# Patient Record
Sex: Female | Born: 1950 | Race: Black or African American | Hispanic: No | Smoking: Former smoker
Health system: Southern US, Community
[De-identification: ages and names within clinical notes are randomized; demographics above are authoritative.]

## PROBLEM LIST (undated history)

## (undated) DIAGNOSIS — E785 Hyperlipidemia, unspecified: Secondary | ICD-10-CM

## (undated) DIAGNOSIS — I10 Essential (primary) hypertension: Secondary | ICD-10-CM

## (undated) DIAGNOSIS — H409 Unspecified glaucoma: Secondary | ICD-10-CM

## (undated) DIAGNOSIS — H269 Unspecified cataract: Secondary | ICD-10-CM

## (undated) DIAGNOSIS — T7840XA Allergy, unspecified, initial encounter: Secondary | ICD-10-CM

## (undated) DIAGNOSIS — M109 Gout, unspecified: Secondary | ICD-10-CM

## (undated) DIAGNOSIS — K219 Gastro-esophageal reflux disease without esophagitis: Secondary | ICD-10-CM

## (undated) DIAGNOSIS — I7300 Raynaud's syndrome without gangrene: Secondary | ICD-10-CM

## (undated) HISTORY — DX: Hyperlipidemia, unspecified: E78.5

## (undated) HISTORY — DX: Unspecified glaucoma: H40.9

## (undated) HISTORY — DX: Gastro-esophageal reflux disease without esophagitis: K21.9

## (undated) HISTORY — PX: CATARACT EXTRACTION: SUR2

## (undated) HISTORY — PX: ESOPHAGOGASTRODUODENOSCOPY: SHX1529

## (undated) HISTORY — DX: Allergy, unspecified, initial encounter: T78.40XA

## (undated) HISTORY — PX: EYE SURGERY: SHX253

## (undated) HISTORY — DX: Unspecified cataract: H26.9

## (undated) HISTORY — DX: Essential (primary) hypertension: I10

---
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
## 1978-03-20 HISTORY — PX: APPENDECTOMY: SHX54

## 2004-06-21 ENCOUNTER — Ambulatory Visit: Payer: Self-pay

## 2004-10-11 ENCOUNTER — Ambulatory Visit: Payer: Self-pay | Admitting: Family Medicine

## 2005-06-28 ENCOUNTER — Ambulatory Visit: Payer: Self-pay

## 2006-06-12 ENCOUNTER — Ambulatory Visit: Payer: Self-pay

## 2007-07-02 ENCOUNTER — Ambulatory Visit: Payer: Self-pay

## 2008-05-18 ENCOUNTER — Ambulatory Visit: Payer: Self-pay | Admitting: Gastroenterology

## 2008-07-02 ENCOUNTER — Ambulatory Visit: Payer: Self-pay

## 2008-07-08 ENCOUNTER — Ambulatory Visit: Payer: Self-pay

## 2009-05-12 DIAGNOSIS — M899 Disorder of bone, unspecified: Secondary | ICD-10-CM | POA: Insufficient documentation

## 2009-11-11 ENCOUNTER — Ambulatory Visit: Payer: Self-pay | Admitting: Family Medicine

## 2011-01-11 ENCOUNTER — Ambulatory Visit: Payer: Self-pay | Admitting: Family Medicine

## 2011-03-02 ENCOUNTER — Ambulatory Visit: Payer: Self-pay | Admitting: Family Medicine

## 2011-09-11 ENCOUNTER — Ambulatory Visit: Payer: Self-pay | Admitting: Family Medicine

## 2011-09-15 ENCOUNTER — Ambulatory Visit: Payer: Self-pay | Admitting: Gastroenterology

## 2011-09-18 ENCOUNTER — Ambulatory Visit: Payer: Self-pay | Admitting: Gastroenterology

## 2012-05-30 ENCOUNTER — Ambulatory Visit: Payer: Self-pay | Admitting: Family Medicine

## 2013-08-22 ENCOUNTER — Ambulatory Visit: Payer: Self-pay | Admitting: Family Medicine

## 2013-12-25 DIAGNOSIS — K5790 Diverticulosis of intestine, part unspecified, without perforation or abscess without bleeding: Secondary | ICD-10-CM

## 2013-12-25 HISTORY — DX: Diverticulosis of intestine, part unspecified, without perforation or abscess without bleeding: K57.90

## 2013-12-25 HISTORY — PX: COLONOSCOPY: SHX174

## 2014-01-28 DIAGNOSIS — M069 Rheumatoid arthritis, unspecified: Secondary | ICD-10-CM | POA: Insufficient documentation

## 2014-01-28 HISTORY — DX: Rheumatoid arthritis, unspecified: M06.9

## 2014-09-15 ENCOUNTER — Ambulatory Visit (INDEPENDENT_AMBULATORY_CARE_PROVIDER_SITE_OTHER): Payer: BC Managed Care – PPO | Admitting: Family Medicine

## 2014-09-15 ENCOUNTER — Encounter: Payer: Self-pay | Admitting: Family Medicine

## 2014-09-15 VITALS — BP 132/82 | HR 70 | Temp 98.1°F | Resp 16 | Wt 152.4 lb

## 2014-09-15 DIAGNOSIS — I7300 Raynaud's syndrome without gangrene: Secondary | ICD-10-CM | POA: Insufficient documentation

## 2014-09-15 DIAGNOSIS — M545 Low back pain, unspecified: Secondary | ICD-10-CM | POA: Insufficient documentation

## 2014-09-15 DIAGNOSIS — M810 Age-related osteoporosis without current pathological fracture: Secondary | ICD-10-CM | POA: Insufficient documentation

## 2014-09-15 DIAGNOSIS — J069 Acute upper respiratory infection, unspecified: Secondary | ICD-10-CM

## 2014-09-15 DIAGNOSIS — G5600 Carpal tunnel syndrome, unspecified upper limb: Secondary | ICD-10-CM | POA: Insufficient documentation

## 2014-09-15 DIAGNOSIS — IMO0002 Reserved for concepts with insufficient information to code with codable children: Secondary | ICD-10-CM | POA: Insufficient documentation

## 2014-09-15 DIAGNOSIS — N949 Unspecified condition associated with female genital organs and menstrual cycle: Secondary | ICD-10-CM | POA: Insufficient documentation

## 2014-09-15 DIAGNOSIS — E7800 Pure hypercholesterolemia, unspecified: Secondary | ICD-10-CM | POA: Insufficient documentation

## 2014-09-15 DIAGNOSIS — I4892 Unspecified atrial flutter: Secondary | ICD-10-CM

## 2014-09-15 DIAGNOSIS — I10 Essential (primary) hypertension: Secondary | ICD-10-CM | POA: Insufficient documentation

## 2014-09-15 DIAGNOSIS — R040 Epistaxis: Secondary | ICD-10-CM | POA: Insufficient documentation

## 2014-09-15 DIAGNOSIS — M8580 Other specified disorders of bone density and structure, unspecified site: Secondary | ICD-10-CM | POA: Insufficient documentation

## 2014-09-15 DIAGNOSIS — I4891 Unspecified atrial fibrillation: Secondary | ICD-10-CM | POA: Insufficient documentation

## 2014-09-15 DIAGNOSIS — M461 Sacroiliitis, not elsewhere classified: Secondary | ICD-10-CM | POA: Insufficient documentation

## 2014-09-15 DIAGNOSIS — R195 Other fecal abnormalities: Secondary | ICD-10-CM | POA: Insufficient documentation

## 2014-09-15 DIAGNOSIS — K635 Polyp of colon: Secondary | ICD-10-CM | POA: Insufficient documentation

## 2014-09-15 NOTE — Patient Instructions (Signed)
Discussed use of Mucinex D for congestion, Delsym for cough, and Benadryl for postnasal drainage 

## 2014-09-15 NOTE — Progress Notes (Signed)
Subjective:     Patient ID: Courtney Rodgers, female   DOB: 04/05/1950, 64 y.o.   MRN: [id number]  HPI  Chief Complaint  Patient presents with  . URI    patient is present in office today with concerns of congestion and sore throat for 3 days and cough for  5 days. Patient reports that she has had episodes of light nose bleed from the left nostril  States she just started to cough today, sinus drainage is thick and clear. Preceding this she has had intermittent sore throat for several weeks. Denies heartburn but has hx of allergies.   Review of Systems  Constitutional: Negative for fever and chills.       Objective:   Physical Exam  Constitutional: She appears well-developed and well-nourished. No distress.  Ears: T.M's intact without inflammation Throat: no tonsillar enlargement or exudate Neck: no cervical adenopathy Lungs: clear     Assessment:    1. Upper respiratory infection    Plan:    Discussed use of Mucinex D for congestion, Delsym for cough, and Benadryl for postnasal drainage Wll f/u in 2-3 weeks.

## 2014-10-05 ENCOUNTER — Encounter: Payer: Self-pay | Admitting: Family Medicine

## 2014-10-05 ENCOUNTER — Ambulatory Visit (INDEPENDENT_AMBULATORY_CARE_PROVIDER_SITE_OTHER): Payer: BC Managed Care – PPO | Admitting: Family Medicine

## 2014-10-05 VITALS — BP 130/78 | HR 83 | Temp 98.5°F | Resp 16

## 2014-10-05 DIAGNOSIS — J309 Allergic rhinitis, unspecified: Secondary | ICD-10-CM | POA: Diagnosis not present

## 2014-10-05 DIAGNOSIS — J029 Acute pharyngitis, unspecified: Secondary | ICD-10-CM

## 2014-10-05 MED ORDER — AMOXICILLIN 875 MG PO TABS: 875.0000 mg | ORAL_TABLET | Freq: Two times a day (BID) | ORAL | Status: DC

## 2014-10-05 MED ORDER — FLUTICASONE PROPIONATE 50 MCG/ACT NA SUSP: 2.0000 | Freq: Every day | NASAL | Status: DC

## 2014-10-05 NOTE — Progress Notes (Signed)
Subjective:     Patient ID: Courtney Rodgers, female   DOB: 11/17/1950, 64 y.o.   MRN: [id number]  HPI  Chief Complaint  Patient presents with  . URI    patient is here for follow up visit from 09/15/14. It was discussed patient try Mucinex D, Delysm and Benadryl. Today patient states that symptoms did not improve but are remaining the same  States she feels cold-like sx have improved but is left with throat discomfort, clear PND, and accompanying cough. Reports she have been cleaning out an Aunt's house prior to the onset of her sx which seem to worsen at night. States she has no pets but her aunt has a dog.   Review of Systems  Constitutional: Negative for fever and chills.       Objective:   Physical Exam  Constitutional: She appears well-developed and well-nourished.  Ears: T.M's intact without inflammation Throat: no tonsillar enlargement or exudate Neck: left anterior cervical node. Lungs: clear     Assessment:    1. Pharyngitis - amoxicillin (AMOXIL) 875 MG tablet; Take 1 tablet (875 mg total) by mouth 2 (two) times daily.  Dispense: 20 tablet; Refill: 0  2. Allergic rhinitis, unspecified allergic rhinitis type - fluticasone (FLONASE) 50 MCG/ACT nasal spray; Place 2 sprays into both nostrils daily.  Dispense: 16 g; Refill: 6    Plan:    Start amoxicillin if allergy medication not helping over the next few days.

## 2014-10-05 NOTE — Patient Instructions (Signed)
Try steroid nasal spray and antihistamine first for a few days. If sore throat persists start antibiotic.

## 2014-10-15 ENCOUNTER — Other Ambulatory Visit: Payer: Self-pay | Admitting: Family Medicine

## 2014-10-15 NOTE — Telephone Encounter (Signed)
Next office visit was on 12/21/2014. Last Office visit was on 10/05/2014.

## 2014-12-21 ENCOUNTER — Ambulatory Visit (INDEPENDENT_AMBULATORY_CARE_PROVIDER_SITE_OTHER): Payer: BC Managed Care – PPO | Admitting: Family Medicine

## 2014-12-21 ENCOUNTER — Encounter: Payer: Self-pay | Admitting: Family Medicine

## 2014-12-21 VITALS — BP 130/74 | HR 82 | Temp 97.5°F | Resp 16 | Ht 63.0 in | Wt 154.0 lb

## 2014-12-21 DIAGNOSIS — R21 Rash and other nonspecific skin eruption: Secondary | ICD-10-CM

## 2014-12-21 DIAGNOSIS — Z1239 Encounter for other screening for malignant neoplasm of breast: Secondary | ICD-10-CM

## 2014-12-21 DIAGNOSIS — Z0000 Encounter for general adult medical examination without abnormal findings: Secondary | ICD-10-CM | POA: Diagnosis not present

## 2014-12-21 DIAGNOSIS — Z124 Encounter for screening for malignant neoplasm of cervix: Secondary | ICD-10-CM

## 2014-12-21 DIAGNOSIS — I10 Essential (primary) hypertension: Secondary | ICD-10-CM | POA: Diagnosis not present

## 2014-12-21 DIAGNOSIS — Z23 Encounter for immunization: Secondary | ICD-10-CM

## 2014-12-21 DIAGNOSIS — Z114 Encounter for screening for human immunodeficiency virus [HIV]: Secondary | ICD-10-CM

## 2014-12-21 DIAGNOSIS — Z1159 Encounter for screening for other viral diseases: Secondary | ICD-10-CM

## 2014-12-21 DIAGNOSIS — E7800 Pure hypercholesterolemia, unspecified: Secondary | ICD-10-CM

## 2014-12-21 LAB — POCT URINALYSIS DIPSTICK
Bilirubin, UA: NEGATIVE
Glucose, UA: NEGATIVE
KETONES UA: NEGATIVE
Leukocytes, UA: NEGATIVE
Nitrite, UA: NEGATIVE
Protein, UA: NEGATIVE
RBC UA: NEGATIVE
Spec Grav, UA: 1.01
UROBILINOGEN UA: 0.2
pH, UA: 8

## 2014-12-21 NOTE — Progress Notes (Signed)
Patient ID: Jesilyn Easom, female   DOB: 02/14/1951, 64 y.o.   MRN: [id number]        Patient: Nikcole Eischeid, Female    DOB: 1950-08-21, 64 y.o.   MRN: [id number] Visit Date: 12/21/2014  Today's Provider: Lorie Phenix, MD   Chief Complaint  Patient presents with  . Annual Exam  . Rash   Subjective:    Annual physical exam Yong Wahlquist is a 64 y.o. female who presents today for health maintenance and complete physical. She feels well. She reports exercising 5-6 days a week. She reports she is sleeping fairly well.  08/18/13 CPE 02/22/11 Pap-neg; HPV-neg 12/25/13 Colonoscopy-WNL 07/08/08 BMD 02/22/11 EKG  Rash: Patient complains of rash involving the buttocks. Rash started 1 year ago (on and off). Appearance of rash at onset: Texture of lesion(s): raised and blistering. Discomfort associated with rash: causes no discomfort.  Associated symptoms: none. Denies: fever. Patient has not had previous evaluation of rash. Patient reports using OTC treatment A & D ointment. Patient has had contacts with similar rash. Patient has not identified precipitant. Patient has not had new exposures (soaps, lotions, laundry detergents, foods, medications, plants, insects or animals.)   -----------------------------------------------------------------   Review of Systems  Constitutional: Positive for unexpected weight change.  HENT: Positive for congestion.   Eyes: Negative.   Respiratory: Negative.   Cardiovascular: Negative.   Gastrointestinal: Negative.   Endocrine: Negative.   Genitourinary: Negative.   Musculoskeletal: Negative.   Skin: Positive for wound.  Allergic/Immunologic: Negative.   Neurological: Negative.   Hematological: Negative.   Psychiatric/Behavioral: Negative.     Social History She  reports that she has quit smoking. Her smoking use included Cigarettes. She has a 20 pack-year smoking history. She has never used smokeless tobacco. She reports that she drinks  about 4.2 oz of alcohol per week. She reports that she does not use illicit drugs. Social History   Social History  . Marital Status: Divorced    Spouse Name: N/A  . Number of Children: N/A  . Years of Education: N/A   Social History Main Topics  . Smoking status: Former Smoker -- 1.00 packs/day for 20 years    Types: Cigarettes  . Smokeless tobacco: Never Used     Comment: QUIT IN 2010  . Alcohol Use: 4.2 oz/week    0 Standard drinks or equivalent, 7 Glasses of wine per week  . Drug Use: No  . Sexual Activity: Not Asked   Other Topics Concern  . None   Social History Narrative    Patient Active Problem List   Diagnosis Date Noted  . Essential (primary) hypertension 09/15/2014  . Hypercholesteremia 09/15/2014  . Hyperplastic polyp of intestine 09/15/2014  . LBP (low back pain) 09/15/2014  . Osteopenia 09/15/2014  . Symptom associated with female genital organs 09/15/2014  . Flutter-fibrillation 09/15/2014  . Epistaxis 09/15/2014  . 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 Nonspecific abnormal finding in stool contents 09/15/2014  . 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 BP (high blood pressure) 09/15/2014  . Carpal tunnel syndrome 09/15/2014  . Rheumatoid arthritis (HCC) 01/28/2014  . Arthritis or polyarthritis, rheumatoid (HCC) 05/12/2009  . Bone/cartilage disorder 05/12/2009    Past Surgical History  Procedure Laterality Date  . Appendectomy  1980  . Tonsillectomy  1960    Family History  Family Status  Relation Status Death Age  . Mother Alive   . Father Deceased   . Daughter Alive   . Sister Alive   .  Brother Alive   . Brother Alive   . Brother Alive   . Brother Alive   . Sister Alive   . Sister Alive    Her family history includes Alcohol abuse in her father; CVA in her father; Congestive Heart Failure in her mother; Dementia in her mother; Diabetes in her mother; Drug abuse in her father; Emphysema in her father; Hypertension in  her brother, brother, father, mother, sister, and sister; Seizures in her brother; Transient ischemic attack in her mother.    No Known Allergies  Previous Medications   ASPIRIN 81 MG EC TABLET    1 tablet daily.   BIOTIN 5000 MCG CAPS    1 capsule daily.   CALCIUM CARBONATE-VIT D-MIN (CALCIUM 1200 PO)    1 tablet daily.   CARVEDILOL (COREG) 12.5 MG TABLET    Take 1 tablet by mouth 2 (two) times daily.   FOLIC ACID (FOLVITE) 800 MCG TABLET    1 tablet daily.   METHOTREXATE 2.5 MG TABLET    7 tablets once a week.    OMEGA-3 FATTY ACIDS (FISH OIL BURP-LESS) 1000 MG CAPS    Take 1 capsule by mouth daily.    TRIAMTERENE-HYDROCHLOROTHIAZIDE (MAXZIDE-25) 37.5-25 MG PER TABLET    TAKE 1 TABLET EVERY DAY    Patient Care Team: Lorie Phenix, MD as PCP - General (Family Medicine)     Objective:   Vitals: BP 130/74 mmHg  Pulse 82  Temp(Src) 97.5 F (36.4 C) (Oral)  Resp 16  Ht 5\' 3"  (1.6 m)  Wt 154 lb (69.854 kg)  BMI 27.29 kg/m2  SpO2 96%   Physical Exam  Constitutional: She is oriented to person, place, and time. She appears well-developed and well-nourished.  HENT:  Head: Normocephalic and atraumatic.  Right Ear: Tympanic membrane, external ear and ear canal normal.  Left Ear: Tympanic membrane, external ear and ear canal normal.  Nose: Nose normal.  Mouth/Throat: Uvula is midline, oropharynx is clear and moist and mucous membranes are normal.  Eyes: Conjunctivae, EOM and lids are normal. Pupils are equal, round, and reactive to light.  Neck: Trachea normal and normal range of motion. Neck supple. Carotid bruit is not present. No thyroid mass and no thyromegaly present.  Cardiovascular: Normal rate, regular rhythm and normal heart sounds.   Pulmonary/Chest: Effort normal and breath sounds normal.  Abdominal: Soft. Normal appearance and bowel sounds are normal. There is no hepatosplenomegaly. There is no tenderness.  Genitourinary: Vagina normal. Rectal exam shows external  hemorrhoid. No breast swelling, tenderness or discharge.  Musculoskeletal: Normal range of motion.  Lymphadenopathy:    She has no cervical adenopathy.    She has no axillary adenopathy.  Neurological: She is alert and oriented to person, place, and time. She has normal strength. No cranial nerve deficit.  Skin: Skin is warm, dry and intact. Lesion (blistered patches) and rash noted.  Psychiatric: She has a normal mood and affect. Her speech is normal and behavior is normal. Judgment and thought content normal. Cognition and memory are normal.     Depression Screen PHQ 2/9 Scores 12/21/2014  PHQ - 2 Score 0   History  Alcohol Use  . 4.2 oz/week  . 0 Standard drinks or equivalent, 7 Glasses of wine per week      Assessment & Plan:     Routine Health Maintenance and Physical Exam  Exercise Activities and Dietary recommendations Goals    . Exercise 150 minutes per week (moderate activity)  Immunization History  Administered Date(s) Administered  . Influenza,inj,Quad PF,36+ Mos 12/21/2014  . Tdap 10/18/2009    Health Maintenance  Topic Date Due  . Hepatitis C Screening  1950-10-01  . HIV Screening  06/03/1967  . PAP SMEAR  06/02/1973  . MAMMOGRAM  06/03/2002  . COLONOSCOPY  06/03/2002  . ZOSTAVAX  06/02/2012  . INFLUENZA VACCINE  10/19/2014  . TETANUS/TDAP  10/19/2019   1. Annual physical exam As above. - POCT urinalysis dipstick Results for orders placed or performed in visit on 12/21/14  POCT urinalysis dipstick  Result Value Ref Range   Color, UA yellow    Clarity, UA clear    Glucose, UA neg    Bilirubin, UA neg    Ketones, UA neg    Spec Grav, UA 1.010    Blood, UA neg    pH, UA 8.0    Protein, UA neg    Urobilinogen, UA 0.2    Nitrite, UA neg    Leukocytes, UA Negative Negative    2. Need for influenza vaccination - Flu Vaccine QUAD 36+ mos IM  3. Essential (primary) hypertension Condition is stable. Please continue current medication  and  plan of care as noted.   - Comprehensive metabolic panel - TSH  4. Hypercholesteremia Check labs.  - CBC with Differential/Platelet - Lipid Panel With LDL/HDL Ratio  5. Need for hepatitis C screening test Will check labs.  - Hepatitis C antibody  6. Screening for HIV (human immunodeficiency virus) Will check labs.  - HIV antibody (with reflex)  7. Rash and nonspecific skin eruption New problem.  Suspicious for herpes.  Will check culture. No known exposure.  No history and not sexually active in many years.  - Viral Culture, Rapid, CMV  8. Breast cancer screening - MM DIGITAL SCREENING BILATERAL; Future  9. Cervical cancer screening - Pap IG and HPV (high risk) DNA detection  Patient was seen and examined by Leo Grosser, MD, and note scribed by Rondel Baton, CMA.   I have reviewed the document for accuracy and completeness and I agree with above. Leo Grosser, MD   Lorie Phenix, MD    --------------------------------------------------------------------          Elbert Ewings

## 2014-12-21 NOTE — Patient Instructions (Signed)
Please call the [facility] at [facility] to schedule this at [phone]   

## 2014-12-23 LAB — CBC WITH DIFFERENTIAL/PLATELET
BASOS ABS: 0 10*3/uL (ref 0.0–0.2)
Basos: 0 %
EOS (ABSOLUTE): 0.1 10*3/uL (ref 0.0–0.4)
EOS: 2 %
HEMATOCRIT: 42.4 % (ref 34.0–46.6)
Hemoglobin: 14.2 g/dL (ref 11.1–15.9)
IMMATURE GRANULOCYTES: 1 %
Immature Grans (Abs): 0 10*3/uL (ref 0.0–0.1)
LYMPHS ABS: 1.1 10*3/uL (ref 0.7–3.1)
Lymphs: 17 %
MCH: 34.5 pg — ABNORMAL HIGH (ref 26.6–33.0)
MCHC: 33.5 g/dL (ref 31.5–35.7)
MCV: 103 fL — ABNORMAL HIGH (ref 79–97)
MONOS ABS: 0.6 10*3/uL (ref 0.1–0.9)
Monocytes: 10 %
NEUTROS PCT: 70 %
Neutrophils Absolute: 4.6 10*3/uL (ref 1.4–7.0)
Platelets: 193 10*3/uL (ref 150–379)
RBC: 4.11 x10E6/uL (ref 3.77–5.28)
RDW: 14.2 % (ref 12.3–15.4)
WBC: 6.5 10*3/uL (ref 3.4–10.8)

## 2014-12-23 LAB — LIPID PANEL WITH LDL/HDL RATIO
CHOLESTEROL TOTAL: 222 mg/dL — AB (ref 100–199)
HDL: 95 mg/dL (ref >39)
LDL Calculated: 109 mg/dL — ABNORMAL HIGH (ref 0–99)
LDL/HDL RATIO: 1.1 ratio (ref 0.0–3.2)
Triglycerides: 90 mg/dL (ref 0–149)
VLDL CHOLESTEROL CAL: 18 mg/dL (ref 5–40)

## 2014-12-23 LAB — COMPREHENSIVE METABOLIC PANEL
ALK PHOS: 57 IU/L (ref 39–117)
ALT: 13 IU/L (ref 0–32)
AST: 22 IU/L (ref 0–40)
Albumin/Globulin Ratio: 1.6 (ref 1.1–2.5)
Albumin: 4.6 g/dL (ref 3.6–4.8)
BUN/Creatinine Ratio: 21 (ref 11–26)
BUN: 14 mg/dL (ref 8–27)
Bilirubin Total: 0.5 mg/dL (ref 0.0–1.2)
CO2: 27 mmol/L (ref 18–29)
CREATININE: 0.67 mg/dL (ref 0.57–1.00)
Calcium: 9.5 mg/dL (ref 8.7–10.3)
Chloride: 101 mmol/L (ref 97–108)
GFR calc Af Amer: 109 mL/min/{1.73_m2} (ref >59)
GFR calc non Af Amer: 95 mL/min/{1.73_m2} (ref >59)
GLUCOSE: 89 mg/dL (ref 65–99)
Globulin, Total: 2.8 g/dL (ref 1.5–4.5)
Potassium: 4.2 mmol/L (ref 3.5–5.2)
SODIUM: 145 mmol/L — AB (ref 134–144)
Total Protein: 7.4 g/dL (ref 6.0–8.5)

## 2014-12-23 LAB — TSH: TSH: 0.974 u[IU]/mL (ref 0.450–4.500)

## 2014-12-23 LAB — HIV ANTIBODY (ROUTINE TESTING W REFLEX): HIV SCREEN 4TH GENERATION: NONREACTIVE

## 2014-12-23 LAB — HEPATITIS C ANTIBODY: Hep C Virus Ab: <0.1 s/co ratio (ref 0.0–0.9)

## 2014-12-24 ENCOUNTER — Encounter: Payer: Self-pay | Admitting: Family Medicine

## 2014-12-24 ENCOUNTER — Telehealth: Payer: Self-pay

## 2014-12-24 LAB — PAP IG AND HPV HIGH-RISK
HPV, HIGH-RISK: NEGATIVE
PAP SMEAR COMMENT: 0

## 2014-12-24 NOTE — Telephone Encounter (Signed)
Patient advised as directed below. Copy of labs up front for pick.  Thanks,  -Joseline

## 2014-12-24 NOTE — Telephone Encounter (Signed)
-----   Message from Lorie Phenix, MD sent at 12/23/2014  3:21 PM EDT ----- Labs stable. Cholesterol looks great. Sound high but that is because of high good cholesterol.  Also, no Hep C or HIV. Rash culture still pending. Will call her when we get these results. Thanks.

## 2014-12-24 NOTE — Telephone Encounter (Signed)
LMTCB 12/24/14  Thanks,  -Sharrell Krawiec 

## 2014-12-25 ENCOUNTER — Telehealth: Payer: Self-pay

## 2014-12-25 NOTE — Telephone Encounter (Signed)
-----   Message from Lorie Phenix, MD sent at 12/25/2014 10:11 AM EDT ----- Pap normal.  Please let patient know that viral culture for skin rash will not be back until next week. Thanks.

## 2014-12-25 NOTE — Telephone Encounter (Signed)
Tried calling; no answer.  12/25/2014  Thanks,   -Vernona Rieger

## 2014-12-29 ENCOUNTER — Other Ambulatory Visit: Payer: Self-pay | Admitting: Family Medicine

## 2014-12-29 DIAGNOSIS — B009 Herpesviral infection, unspecified: Secondary | ICD-10-CM | POA: Insufficient documentation

## 2014-12-29 LAB — SPECIMEN STATUS REPORT

## 2014-12-29 LAB — HERPES CULTURE, RAPID

## 2014-12-29 MED ORDER — VALACYCLOVIR HCL 500 MG PO TABS: 500.0000 mg | ORAL_TABLET | Freq: Two times a day (BID) | ORAL | Status: DC

## 2014-12-29 NOTE — Telephone Encounter (Signed)
LMTCB. sd  

## 2014-12-31 LAB — VIRAL CULTURE, RAPID, CMV

## 2014-12-31 LAB — PLEASE NOTE

## 2015-02-17 ENCOUNTER — Ambulatory Visit
Admission: RE | Admit: 2015-02-17 | Discharge: 2015-02-17 | Disposition: A | Discharge status: Home / Self Care | Payer: BC Managed Care – PPO | Source: Ambulatory Visit | Attending: Family Medicine | Admitting: Family Medicine

## 2015-02-17 DIAGNOSIS — Z1231 Encounter for screening mammogram for malignant neoplasm of breast: Secondary | ICD-10-CM | POA: Insufficient documentation

## 2015-02-17 DIAGNOSIS — Z1239 Encounter for other screening for malignant neoplasm of breast: Secondary | ICD-10-CM

## 2015-03-24 ENCOUNTER — Other Ambulatory Visit: Payer: Self-pay | Admitting: Family Medicine

## 2015-03-24 DIAGNOSIS — I10 Essential (primary) hypertension: Secondary | ICD-10-CM

## 2015-10-20 ENCOUNTER — Other Ambulatory Visit: Payer: Self-pay | Admitting: Family Medicine

## 2015-10-20 DIAGNOSIS — I10 Essential (primary) hypertension: Secondary | ICD-10-CM

## 2016-02-01 ENCOUNTER — Encounter: Payer: Self-pay | Admitting: Physician Assistant

## 2016-02-01 ENCOUNTER — Ambulatory Visit (INDEPENDENT_AMBULATORY_CARE_PROVIDER_SITE_OTHER): Payer: BC Managed Care – PPO | Admitting: Physician Assistant

## 2016-02-01 VITALS — BP 138/82 | HR 64 | Temp 98.5°F | Resp 16 | Wt 156.0 lb

## 2016-02-01 DIAGNOSIS — Z0000 Encounter for general adult medical examination without abnormal findings: Secondary | ICD-10-CM

## 2016-02-01 DIAGNOSIS — D7589 Other specified diseases of blood and blood-forming organs: Secondary | ICD-10-CM | POA: Diagnosis not present

## 2016-02-01 DIAGNOSIS — B009 Herpesviral infection, unspecified: Secondary | ICD-10-CM

## 2016-02-01 DIAGNOSIS — M059 Rheumatoid arthritis with rheumatoid factor, unspecified: Secondary | ICD-10-CM

## 2016-02-01 DIAGNOSIS — I10 Essential (primary) hypertension: Secondary | ICD-10-CM | POA: Diagnosis not present

## 2016-02-01 MED ORDER — TRIAMTERENE-HCTZ 37.5-25 MG PO TABS: 1.0000 | ORAL_TABLET | Freq: Every day | ORAL | 1 refills | Status: DC

## 2016-02-01 MED ORDER — CARVEDILOL 12.5 MG PO TABS: 12.5000 mg | ORAL_TABLET | Freq: Two times a day (BID) | ORAL | 1 refills | Status: DC

## 2016-02-01 NOTE — Patient Instructions (Signed)

## 2016-02-01 NOTE — Progress Notes (Addendum)
Patient: Courtney Rodgers, Female    DOB: [birth year], 65 y.o.   MRN: [id number] Visit Date: 02/01/2016  Today's Provider: Trinna Post, PA-C   Chief Complaint  Patient presents with  . Annual Exam   Subjective:    Annual physical exam Courtney Rodgers is a 65 y.o. female who presents today for health maintenance and complete physical. She feels well.  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   She reports she is not exercising right now.  She used to do water aerobics and has stopped secondary to her herpes diagnosis. She reports she is sleeping well.  Patient is a 65 y/o female with a history of HTN on Coreg 25 mg daily and Maxzide 37.5-25mg daily, HSV-2, and rheumatoid arthritis on methotrexate presenting today for her annual physical.   Her last PAP smear was on 12/21/2014 and was negative for atypical cells and HPV. No history of abnormal PAP. Does need a mammogram scheduled, gets her exams done at [facility].   She was diagnosed last year with HSV-2 after an outbreak of blisters on her rectal area. She has been given a prescription of Valtrex to take during outbreaks. She reports she has not needed to use it as she has had only around 3 episodes of a single lesion since she's last been seen. She has however been very distressed by this diagnosis. She reports she has stopped her water aerobics because she is scared of contaminating the water, and she is isolating herself from people. She is eating and drinking okay.  Her last colonoscopy was done in 12/2013. She is on a five year schedule 2/2 history of polyps on colonoscopy. No family history of bowel disease.  She has a 20 pack year smoking history, quit five years ago. Never had screening Ct.  She is followed by [facility] for her rheumatoid arthritis. She is doing well on methotrexate. She gets labs every three months with [facility]. Most recent CBC revealed a stable macrocytosis. She had B12 and folate  tested in 2012 by Reginia Forts, Md. These were normal. Patient not having any numbness, tingling, glossitis. Patient takes a folic acid supplement. She does report drinking 1-2 4 oz glasses of wine per night, which is more so than last year.   Patient reports she has had her flu vaccine this year and also had the Shingles vaccine at a [facility] a few years ago. -----------------------------------------------------------------   Review of Systems  Constitutional: Negative.   HENT: Positive for congestion. Negative for dental problem.   Eyes: Positive for redness.  Respiratory: Negative.   Cardiovascular: Negative.   Gastrointestinal: Negative.   Endocrine: Negative.   Genitourinary: Positive for genital sores. Negative for decreased urine volume, difficulty urinating, dyspareunia, dysuria, enuresis, flank pain, frequency, hematuria, menstrual problem, pelvic pain, urgency, vaginal bleeding, vaginal discharge and vaginal pain.  Musculoskeletal: Positive for arthralgias.  Skin: Positive for rash.  Allergic/Immunologic: Negative.   Neurological: Negative.   Hematological: Negative.   Psychiatric/Behavioral: Negative for agitation, behavioral problems, confusion, decreased concentration, dysphoric mood, hallucinations, self-injury, sleep disturbance and suicidal ideas. The patient is not nervous/anxious and is not hyperactive.     Social History      She  reports that she has quit smoking. Her smoking use included Cigarettes. She has a 20.00 pack-year smoking history. She has never used smokeless tobacco. She reports that she drinks about 4.2 oz of alcohol per week . She reports that she  does not use drugs.       Social History   Social History  . Marital status: Divorced    Spouse name: N/A  . Number of children: N/A  . Years of education: N/A   Social History Main Topics  . Smoking status: Former Smoker    Packs/day: 1.00    Years: 20.00    Types: Cigarettes  . Smokeless tobacco:  Never Used     Comment: QUIT IN 2010  . Alcohol use 4.2 oz/week    7 Glasses of wine per week  . Drug use: No  . Sexual activity: Not Asked   Other Topics Concern  . None   Social History Narrative  . None    Past Medical History:  Diagnosis Date  . Allergy   . Hyperlipidemia   . Hypertension      Patient Active Problem List   Diagnosis Date Noted  . 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 Essential (primary) hypertension 09/15/2014  . Hypercholesteremia 09/15/2014  . Hyperplastic polyp of intestine 09/15/2014  . LBP (low back pain) 09/15/2014  . Osteopenia 09/15/2014  . Symptom associated with female genital organs 09/15/2014  . Flutter-fibrillation 09/15/2014  . Epistaxis 09/15/2014  . 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 Nonspecific abnormal finding in stool contents 09/15/2014  . 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 BP (high blood pressure) 09/15/2014  . Carpal tunnel syndrome 09/15/2014  . Rheumatoid arthritis ([place]) 01/28/2014  . Arthritis or polyarthritis, rheumatoid ([place]) 05/12/2009  . Bone/cartilage disorder 05/12/2009    Past Surgical History:  Procedure Laterality Date  . APPENDECTOMY  1980  . TONSILLECTOMY  1960    Family History        Family Status  Relation Status  . Mother Alive  . Father Deceased  . Daughter Alive  . Sister Alive  . Brother Alive  . Brother Alive  . Brother Alive  . Brother Alive  . Sister Alive  . Sister Alive        Her family history includes Alcohol abuse in her father; CVA in her father; Congestive Heart Failure in her mother; Dementia in her mother; Diabetes in her mother; Drug abuse in her father; Emphysema in her father; Hypertension in her brother, brother, father, mother, sister, and sister; Seizures in her brother; Transient ischemic attack in her mother.     No Known Allergies   Current Outpatient Prescriptions:  .  Aspirin 81 MG EC tablet, 1 tablet daily., Disp: ,  Rfl:  .  Biotin 5000 MCG CAPS, 1 capsule daily., Disp: , Rfl:  .  Calcium Carbonate-Vit D-Min (CALCIUM 1200 PO), 1 tablet daily., Disp: , Rfl:  .  carvedilol (COREG) 12.5 MG tablet, Take 1 tablet (12.5 mg total) by mouth 2 (two) times daily., Disp: 180 tablet, Rfl: 1 .  folic acid (FOLVITE) 530 MCG tablet, 1 tablet daily., Disp: , Rfl:  .  methotrexate 2.5 MG tablet, 7 tablets once a week. , Disp: , Rfl:  .  Omega-3 Fatty Acids (FISH OIL BURP-LESS) 1000 MG CAPS, Take 1 capsule by mouth daily. , Disp: , Rfl:  .  triamterene-hydrochlorothiazide (MAXZIDE-25) 37.5-25 MG tablet, Take 1 tablet by mouth daily., Disp: 90 tablet, Rfl: 1 .  valACYclovir (VALTREX) 500 MG tablet, Take 1 tablet (500 mg total) by mouth 2 (two) times daily. As needed, Disp: 20 tablet, Rfl: 5   Patient Care Team: Mar Daring, PA-C as PCP - General (Family Medicine)  Objective:   Vitals: BP 138/82 (BP Location: Left Arm, Patient Position: Sitting, Cuff Size: Normal)   Pulse 64   Temp 98.5 F (36.9 C) (Oral)   Resp 16   Wt 156 lb (70.8 kg)   BMI 27.63 kg/m    Physical Exam  Constitutional: She is oriented to person, place, and time. She appears well-developed and well-nourished. No distress.  HENT:  Right Ear: Tympanic membrane and external ear normal.  Left Ear: Tympanic membrane and external ear normal.  Mouth/Throat: Oropharynx is clear and moist. No oropharyngeal exudate.  Eyes: Conjunctivae are normal.  Neck: Neck supple. No thyromegaly present.  Cardiovascular: Normal rate, regular rhythm, normal heart sounds and intact distal pulses.   Pulmonary/Chest: Effort normal and breath sounds normal. Right breast exhibits no inverted nipple, no mass, no nipple discharge, no skin change and no tenderness. Left breast exhibits no inverted nipple, no mass, no nipple discharge, no skin change and no tenderness. Breasts are symmetrical.  Abdominal: Soft. Bowel sounds are normal.  Lymphadenopathy:    She has  no cervical adenopathy.  Neurological: She is alert and oriented to person, place, and time.  Skin: Skin is warm and dry. She is not diaphoretic.  Psychiatric: She has a normal mood and affect. Her behavior is normal.     Depression Screen PHQ 2/9 Scores 12/21/2014  PHQ - 2 Score 0      Assessment & Plan:     Routine Health Maintenance and Physical Exam  Exercise Activities and Dietary recommendations Goals    . Exercise 150 minutes per week (moderate activity)       Immunization History  Administered Date(s) Administered  . Influenza,inj,Quad PF,36+ Mos 12/21/2014  . Tdap 10/18/2009    Health Maintenance  Topic Date Due  . ZOSTAVAX  06/02/2012  . INFLUENZA VACCINE  10/18/2016 (Originally 10/19/2015)  . MAMMOGRAM  02/16/2017  . PAP SMEAR  12/20/2017  . TETANUS/TDAP  10/19/2019  . COLONOSCOPY  12/26/2023  . Hepatitis C Screening  Completed  . HIV Screening  Completed     Discussed health benefits of physical activity, and encouraged her to engage in regular exercise appropriate for her age and condition.    Problem List Items Addressed This Visit      Cardiovascular and Mediastinum   BP (high blood pressure)   Relevant Medications   carvedilol (COREG) 12.5 MG tablet   triamterene-hydrochlorothiazide (MAXZIDE-25) 37.5-25 MG tablet     Musculoskeletal and Integument   Rheumatoid arthritis (HCC)     Other   HSV-2 (herpes simplex virus 2) infection    Other Visit Diagnoses    Annual physical exam    -  Primary   Relevant Orders   TSH   Comprehensive metabolic panel   Lipid panel   MM Digital Screening   CT CHEST LUNG CA SCREEN LOW DOSE W/O CM   Macrocytosis without anemia         Patient is a 65 y.o female presenting for annual physical. Did not order CBC 2/2 recent lab from [place], stable macrocytosis.   Ordered labs as above. Patient should be fasting for these.  HSV-2 Counseled patient on HSV, spreading, activities that she may do. She should  continue her water aerobics, she will not spread the virus this way. Likely infected decades ago with recent emergence of virus.  HTN  Stable on current medications.  Screening Have scheduled mammogram and low dose CT for lung cancer screening.  The entirety of the information documented  in the History of Present Illness, Review of Systems and Physical Exam were personally obtained by me. Portions of this information were initially documented by Ashley Royalty, CMA and reviewed by me for thoroughness and accuracy.   Patient Instructions  Health Maintenance, Female Introduction Adopting a healthy lifestyle and getting preventive care can go a long way to promote health and wellness. Talk with your health care provider about what schedule of regular examinations is right for you. This is a good chance for you to check in with your provider about disease prevention and staying healthy. In between checkups, there are plenty of things you can do on your own. Experts have done a lot of research about which lifestyle changes and preventive measures are most likely to keep you healthy. Ask your health care provider for more information. Weight and diet Eat a healthy diet  Be sure to include plenty of vegetables, fruits, low-fat dairy products, and lean protein.  Do not eat a lot of foods high in solid fats, added sugars, or salt.  Get regular exercise. This is one of the most important things you can do for your health.  Most adults should exercise for at least 150 minutes each week. The exercise should increase your heart rate and make you sweat (moderate-intensity exercise).  Most adults should also do strengthening exercises at least twice a week. This is in addition to the moderate-intensity exercise. Maintain a healthy weight  Body mass index (BMI) is a measurement that can be used to identify possible weight problems. It estimates body fat based on height and weight. Your health care  provider can help determine your BMI and help you achieve or maintain a healthy weight.  For females 61 years of age and older:  A BMI below 18.5 is considered underweight.  A BMI of 18.5 to 24.9 is normal.  A BMI of 25 to 29.9 is considered overweight.  A BMI of 30 and above is considered obese. Watch levels of cholesterol and blood lipids  You should start having your blood tested for lipids and cholesterol at 63 years of age, then have this test every 5 years.  You may need to have your cholesterol levels checked more often if:  Your lipid or cholesterol levels are high.  You are older than 63 years of age.  You are at high risk for heart disease. Cancer screening Lung Cancer  Lung cancer screening is recommended for adults 21-29 years old who are at high risk for lung cancer because of a history of smoking.  A yearly low-dose CT scan of the lungs is recommended for people who:  Currently smoke.  Have quit within the past 15 years.  Have at least a 30-pack-year history of smoking. A pack year is smoking an average of one pack of cigarettes a day for 1 year.  Yearly screening should continue until it has been 15 years since you quit.  Yearly screening should stop if you develop a health problem that would prevent you from having lung cancer treatment. Breast Cancer  Practice breast self-awareness. This means understanding how your breasts normally appear and feel.  It also means doing regular breast self-exams. Let your health care provider know about any changes, no matter how small.  If you are in your 20s or 30s, you should have a clinical breast exam (CBE) by a health care provider every 1-3 years as part of a regular health exam.  If you are 31 or older, have a  CBE every year. Also consider having a breast X-ray (mammogram) every year.  If you have a family history of breast cancer, talk to your health care provider about genetic screening.  If you are at high  risk for breast cancer, talk to your health care provider about having an MRI and a mammogram every year.  Breast cancer gene (BRCA) assessment is recommended for women who have family members with BRCA-related cancers. BRCA-related cancers include:  Breast.  Ovarian.  Tubal.  Peritoneal cancers.  Results of the assessment will determine the need for genetic counseling and BRCA1 and BRCA2 testing. Cervical Cancer  Your health care provider may recommend that you be screened regularly for cancer of the pelvic organs (ovaries, uterus, and vagina). This screening involves a pelvic examination, including checking for microscopic changes to the surface of your cervix (Pap test). You may be encouraged to have this screening done every 3 years, beginning at age 55.  For women ages 64-65, health care providers may recommend pelvic exams and Pap testing every 3 years, or they may recommend the Pap and pelvic exam, combined with testing for human papilloma virus (HPV), every 5 years. Some types of HPV increase your risk of cervical cancer. Testing for HPV may also be done on women of any age with unclear Pap test results.  Other health care providers may not recommend any screening for nonpregnant women who are considered low risk for pelvic cancer and who do not have symptoms. Ask your health care provider if a screening pelvic exam is right for you.  If you have had past treatment for cervical cancer or a condition that could lead to cancer, you need Pap tests and screening for cancer for at least 20 years after your treatment. If Pap tests have been discontinued, your risk factors (such as having a new sexual partner) need to be reassessed to determine if screening should resume. Some women have medical problems that increase the chance of getting cervical cancer. In these cases, your health care provider may recommend more frequent screening and Pap tests. Colorectal Cancer  This type of cancer can  be detected and often prevented.  Routine colorectal cancer screening usually begins at 64 years of age and continues through 63 years of age.  Your health care provider may recommend screening at an earlier age if you have risk factors for colon cancer.  Your health care provider may also recommend using home test kits to check for hidden blood in the stool.  A small camera at the end of a tube can be used to examine your colon directly (sigmoidoscopy or colonoscopy). This is done to check for the earliest forms of colorectal cancer.  Routine screening usually begins at age 75.  Direct examination of the colon should be repeated every 5-10 years through 63 years of age. However, you may need to be screened more often if early forms of precancerous polyps or small growths are found. Skin Cancer  Check your skin from head to toe regularly.  Tell your health care provider about any new moles or changes in moles, especially if there is a change in a mole's shape or color.  Also tell your health care provider if you have a mole that is larger than the size of a pencil eraser.  Always use sunscreen. Apply sunscreen liberally and repeatedly throughout the day.  Protect yourself by wearing long sleeves, pants, a wide-brimmed hat, and sunglasses whenever you are outside. Heart disease, diabetes, and high  blood pressure  High blood pressure causes heart disease and increases the risk of stroke. High blood pressure is more likely to develop in:  People who have blood pressure in the high end of the normal range (130-139/85-89 mm Hg).  People who are overweight or obese.  People who are African American.  If you are 44-69 years of age, have your blood pressure checked every 3-5 years. If you are 68 years of age or older, have your blood pressure checked every year. You should have your blood pressure measured twice-once when you are at a hospital or clinic, and once when you are not at a  hospital or clinic. Record the average of the two measurements. To check your blood pressure when you are not at a hospital or clinic, you can use:  An automated blood pressure machine at a pharmacy.  A home blood pressure monitor.  If you are between 65 years and 6 years old, ask your health care provider if you should take aspirin to prevent strokes.  Have regular diabetes screenings. This involves taking a blood sample to check your fasting blood sugar level.  If you are at a normal weight and have a low risk for diabetes, have this test once every three years after 63 years of age.  If you are overweight and have a high risk for diabetes, consider being tested at a younger age or more often. Preventing infection Hepatitis B  If you have a higher risk for hepatitis B, you should be screened for this virus. You are considered at high risk for hepatitis B if:  You were born in a country where hepatitis B is common. Ask your health care provider which countries are considered high risk.  Your parents were born in a high-risk country, and you have not been immunized against hepatitis B (hepatitis B vaccine).  You have HIV or AIDS.  You use needles to inject street drugs.  You live with someone who has hepatitis B.  You have had sex with someone who has hepatitis B.  You get hemodialysis treatment.  You take certain medicines for conditions, including cancer, organ transplantation, and autoimmune conditions. Hepatitis C  Blood testing is recommended for:  Everyone born from [birth year] through 1963.  Anyone with known risk factors for hepatitis C. Sexually transmitted infections (STIs)  You should be screened for sexually transmitted infections (STIs) including gonorrhea and chlamydia if:  You are sexually active and are younger than 62 years of age.  You are older than 63 years of age and your health care provider tells you that you are at risk for this type of  infection.  Your sexual activity has changed since you were last screened and you are at an increased risk for chlamydia or gonorrhea. Ask your health care provider if you are at risk.  If you do not have HIV, but are at risk, it may be recommended that you take a prescription medicine daily to prevent HIV infection. This is called pre-exposure prophylaxis (PrEP). You are considered at risk if:  You are sexually active and do not regularly use condoms or know the HIV status of your partner(s).  You take drugs by injection.  You are sexually active with a partner who has HIV. Talk with your health care provider about whether you are at high risk of being infected with HIV. If you choose to begin PrEP, you should first be tested for HIV. You should then be tested every 3 months  for as long as you are taking PrEP. Pregnancy  If you are premenopausal and you may become pregnant, ask your health care provider about preconception counseling.  If you may become pregnant, take 400 to 800 micrograms (mcg) of folic acid every day.  If you want to prevent pregnancy, talk to your health care provider about birth control (contraception). Osteoporosis and menopause  Osteoporosis is a disease in which the bones lose minerals and strength with aging. This can result in serious bone fractures. Your risk for osteoporosis can be identified using a bone density scan.  If you are 70 years of age or older, or if you are at risk for osteoporosis and fractures, ask your health care provider if you should be screened.  Ask your health care provider whether you should take a calcium or vitamin D supplement to lower your risk for osteoporosis.  Menopause may have certain physical symptoms and risks.  Hormone replacement therapy may reduce some of these symptoms and risks. Talk to your health care provider about whether hormone replacement therapy is right for you. Follow these instructions at home:  Schedule  regular health, dental, and eye exams.  Stay current with your immunizations.  Do not use any tobacco products including cigarettes, chewing tobacco, or electronic cigarettes.  If you are pregnant, do not drink alcohol.  If you are breastfeeding, limit how much and how often you drink alcohol.  Limit alcohol intake to no more than 1 drink per day for nonpregnant women. One drink equals 12 ounces of beer, 5 ounces of wine, or 1 ounces of hard liquor.  Do not use street drugs.  Do not share needles.  Ask your health care provider for help if you need support or information about quitting drugs.  Tell your health care provider if you often feel depressed.  Tell your health care provider if you have ever been abused or do not feel safe at home. This information is not intended to replace advice given to you by your health care provider. Make sure you discuss any questions you have with your health care provider. Document Released: 09/19/2010 Document Revised: 08/12/2015 Document Reviewed: 12/08/2014  2017 Elsevier   No Follow-up on file.  --------------------------------------------------------------------    Trinna Post, PA-C  [facility]

## 2016-02-04 ENCOUNTER — Telehealth: Payer: Self-pay

## 2016-02-04 LAB — COMPREHENSIVE METABOLIC PANEL
ALT: 19 IU/L (ref 0–32)
AST: 30 IU/L (ref 0–40)
Albumin/Globulin Ratio: 1.9 (ref 1.2–2.2)
Albumin: 4.5 g/dL (ref 3.6–4.8)
Alkaline Phosphatase: 50 IU/L (ref 39–117)
BUN/Creatinine Ratio: 32 — ABNORMAL HIGH (ref 12–28)
BUN: 22 mg/dL (ref 8–27)
Bilirubin Total: 0.6 mg/dL (ref 0.0–1.2)
CO2: 23 mmol/L (ref 18–29)
Calcium: 9.4 mg/dL (ref 8.7–10.3)
Chloride: 99 mmol/L (ref 96–106)
Creatinine, Ser: 0.69 mg/dL (ref 0.57–1.00)
GFR calc Af Amer: 107 mL/min/{1.73_m2} (ref >59)
GFR calc non Af Amer: 93 mL/min/{1.73_m2} (ref >59)
Globulin, Total: 2.4 g/dL (ref 1.5–4.5)
Glucose: 88 mg/dL (ref 65–99)
Potassium: 3.9 mmol/L (ref 3.5–5.2)
Sodium: 141 mmol/L (ref 134–144)
Total Protein: 6.9 g/dL (ref 6.0–8.5)

## 2016-02-04 LAB — LIPID PANEL
Chol/HDL Ratio: 2.4 ratio units (ref 0.0–4.4)
Cholesterol, Total: 236 mg/dL — ABNORMAL HIGH (ref 100–199)
HDL: 99 mg/dL (ref >39)
LDL Calculated: 118 mg/dL — ABNORMAL HIGH (ref 0–99)
Triglycerides: 95 mg/dL (ref 0–149)
VLDL Cholesterol Cal: 19 mg/dL (ref 5–40)

## 2016-02-04 LAB — TSH: TSH: 1.08 u[IU]/mL (ref 0.450–4.500)

## 2016-02-04 NOTE — Telephone Encounter (Signed)
-----   Message from Trey Sailors, [place] sent at 02/04/2016  8:41 AM EST ----- TSH normal. CMP normal, indicates some likely dehydration. Cholesterol technically high, 2/2 high HDL. 2.5% 10 year CVD risk, no Dm, no evidence of CVD, no statin indicated. Keep up the good work.

## 2016-02-04 NOTE — Telephone Encounter (Signed)
Patient has been advised. KW 

## 2016-02-18 ENCOUNTER — Telehealth: Payer: Self-pay | Admitting: *Deleted

## 2016-02-18 NOTE — Telephone Encounter (Signed)
Received referral for initial lung cancer screening scan. Contacted patient and obtained smoking history,(former, quit 2011, 32 pack year) as well as answering questions related to screening process. Patient denies signs of lung cancer such as weight loss or hemoptysis. Patient denies comorbidity that would prevent curative treatment if lung cancer were found. Patient is tentatively scheduled for shared decision making visit and CT scan on 02/29/16 at 1:30pm, pending insurance approval from business office.

## 2016-02-28 ENCOUNTER — Other Ambulatory Visit: Payer: Self-pay | Admitting: *Deleted

## 2016-02-28 DIAGNOSIS — Z87891 Personal history of nicotine dependence: Secondary | ICD-10-CM

## 2016-02-29 ENCOUNTER — Inpatient Hospital Stay: Payer: BC Managed Care – PPO | Attending: Oncology | Admitting: Oncology

## 2016-02-29 ENCOUNTER — Ambulatory Visit
Admission: RE | Admit: 2016-02-29 | Discharge: 2016-02-29 | Disposition: A | Discharge status: Home / Self Care | Payer: BC Managed Care – PPO | Source: Ambulatory Visit | Attending: Oncology | Admitting: Oncology

## 2016-02-29 ENCOUNTER — Encounter: Payer: Self-pay | Admitting: Oncology

## 2016-02-29 DIAGNOSIS — J432 Centrilobular emphysema: Secondary | ICD-10-CM | POA: Insufficient documentation

## 2016-02-29 DIAGNOSIS — Z87891 Personal history of nicotine dependence: Secondary | ICD-10-CM | POA: Diagnosis not present

## 2016-02-29 DIAGNOSIS — R911 Solitary pulmonary nodule: Secondary | ICD-10-CM | POA: Diagnosis not present

## 2016-02-29 DIAGNOSIS — Z122 Encounter for screening for malignant neoplasm of respiratory organs: Secondary | ICD-10-CM | POA: Insufficient documentation

## 2016-02-29 DIAGNOSIS — I700 Atherosclerosis of aorta: Secondary | ICD-10-CM | POA: Insufficient documentation

## 2016-03-01 ENCOUNTER — Encounter: Payer: Self-pay | Admitting: *Deleted

## 2016-03-03 DIAGNOSIS — Z87891 Personal history of nicotine dependence: Secondary | ICD-10-CM | POA: Insufficient documentation

## 2016-03-03 NOTE — Progress Notes (Signed)
In accordance with CMS guidelines, patient has met eligibility criteria including age, absence of signs or symptoms of lung cancer.  Social History  Substance Use Topics  . Smoking status: Former Smoker    Packs/day: 1.00    Years: 32.00    Types: Cigarettes    Quit date: 2011  . Smokeless tobacco: Never Used     Comment: QUIT IN 2010  . Alcohol use 4.2 oz/week    7 Glasses of wine per week     A shared decision-making session was conducted prior to the performance of CT scan. This includes one or more decision aids, includes benefits and harms of screening, follow-up diagnostic testing, over-diagnosis, false positive rate, and total radiation exposure.  Counseling on the importance of adherence to annual lung cancer LDCT screening, impact of co-morbidities, and ability or willingness to undergo diagnosis and treatment is imperative for compliance of the program.  Counseling on the importance of continued smoking cessation for former smokers; the importance of smoking cessation for current smokers, and information about tobacco cessation interventions have been given to patient including [place] and [facility] [place] programs.  Written order for lung cancer screening with LDCT has been given to the patient and any and all questions have been answered to the best of my abilities.   Yearly follow up will be coordinated by Burgess Estelle, Thoracic Navigator.

## 2016-03-22 ENCOUNTER — Ambulatory Visit
Admission: RE | Admit: 2016-03-22 | Discharge: 2016-03-22 | Disposition: A | Discharge status: Home / Self Care | Payer: BC Managed Care – PPO | Source: Ambulatory Visit | Attending: Physician Assistant | Admitting: Physician Assistant

## 2016-03-22 DIAGNOSIS — Z1231 Encounter for screening mammogram for malignant neoplasm of breast: Secondary | ICD-10-CM | POA: Diagnosis not present

## 2016-03-22 DIAGNOSIS — Z0000 Encounter for general adult medical examination without abnormal findings: Secondary | ICD-10-CM

## 2016-03-24 ENCOUNTER — Other Ambulatory Visit: Payer: Self-pay | Admitting: Physician Assistant

## 2016-03-24 DIAGNOSIS — R928 Other abnormal and inconclusive findings on diagnostic imaging of breast: Secondary | ICD-10-CM

## 2016-03-24 DIAGNOSIS — N6310 Unspecified lump in the right breast, unspecified quadrant: Secondary | ICD-10-CM

## 2016-04-04 ENCOUNTER — Ambulatory Visit
Admission: RE | Admit: 2016-04-04 | Discharge: 2016-04-04 | Disposition: A | Discharge status: Home / Self Care | Payer: BC Managed Care – PPO | Source: Ambulatory Visit | Attending: Physician Assistant | Admitting: Physician Assistant

## 2016-04-04 DIAGNOSIS — N6310 Unspecified lump in the right breast, unspecified quadrant: Secondary | ICD-10-CM

## 2016-04-04 DIAGNOSIS — R928 Other abnormal and inconclusive findings on diagnostic imaging of breast: Secondary | ICD-10-CM | POA: Diagnosis not present

## 2016-04-04 DIAGNOSIS — N6489 Other specified disorders of breast: Secondary | ICD-10-CM | POA: Diagnosis present

## 2016-10-28 ENCOUNTER — Other Ambulatory Visit: Payer: Self-pay | Admitting: Physician Assistant

## 2016-10-28 DIAGNOSIS — I10 Essential (primary) hypertension: Secondary | ICD-10-CM

## 2017-01-25 ENCOUNTER — Encounter: Payer: Self-pay | Admitting: Physician Assistant

## 2017-01-25 ENCOUNTER — Ambulatory Visit: Payer: BC Managed Care – PPO | Admitting: Physician Assistant

## 2017-01-25 VITALS — BP 126/82 | HR 72 | Temp 97.7°F | Resp 16 | Wt 157.0 lb

## 2017-01-25 DIAGNOSIS — J0100 Acute maxillary sinusitis, unspecified: Secondary | ICD-10-CM

## 2017-01-25 MED ORDER — DOXYCYCLINE HYCLATE 100 MG PO TABS: 100.0000 mg | ORAL_TABLET | Freq: Two times a day (BID) | ORAL | 0 refills | Status: AC

## 2017-01-25 NOTE — Progress Notes (Signed)
[name]  Chief Complaint  Patient presents with  . URI    Started about two weeks ago.  . Sinusitis    Subjective:    Patient ID: Courtney Rodgers, female    DOB: 1950-06-16, 66 y.o.   MRN: [id number]  Upper Respiratory Infection: Courtney Rodgers is a 66 y.o. female complaining of symptoms of a URI, possible sinusitis. Symptoms include congestion, cough, plugged sensation in both ears and swollen glands. Onset of symptoms was 2 weeks ago, gradually worsening since that time. She also c/o congestion, cough described as productive, nasal congestion, post nasal drip and sinus pressure for the past 2 weeks .  She is drinking plenty of fluids. Evaluation to date: none. Treatment to date: none. The treatment has provided minimal relief.   Review of Systems  Constitutional: Positive for chills, diaphoresis and fatigue. Negative for activity change, appetite change, fever and unexpected weight change.  HENT: Positive for congestion, ear pain, postnasal drip, rhinorrhea, sinus pressure and sinus pain. Negative for ear discharge, nosebleeds, sneezing, sore throat, tinnitus, trouble swallowing and voice change.   Eyes: Positive for itching. Negative for photophobia, pain, discharge, redness and visual disturbance.  Respiratory: Positive for cough, chest tightness and wheezing. Negative for apnea, choking, shortness of breath and stridor.   Gastrointestinal: Negative.   Musculoskeletal: Negative for neck pain and neck stiffness.  Neurological: Negative for dizziness, light-headedness and headaches.       Objective:   There were no vitals taken for this visit.  Patient Active Problem List   Diagnosis Date Noted  . Personal history of tobacco use, presenting hazards to health 03/03/2016  . 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 Essential (primary) hypertension 09/15/2014  . Hypercholesteremia 09/15/2014  . Hyperplastic polyp of  intestine 09/15/2014  . LBP (low back pain) 09/15/2014  . Osteopenia 09/15/2014  . Symptom associated with female genital organs 09/15/2014  . Flutter-fibrillation 09/15/2014  . Epistaxis 09/15/2014  . 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 Nonspecific abnormal finding in stool contents 09/15/2014  . 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 BP (high blood pressure) 09/15/2014  . Carpal tunnel syndrome 09/15/2014  . Rheumatoid arthritis (HCC) 01/28/2014  . Arthritis or polyarthritis, rheumatoid (HCC) 05/12/2009  . Bone/cartilage disorder 05/12/2009    Outpatient Encounter Medications as of 01/25/2017  Medication Sig Note  . Aspirin 81 MG EC tablet 1 tablet daily. 09/15/2014: Received from: [facility] Received Sig:   . Biotin 5000 MCG CAPS 1 capsule daily. 09/15/2014: Received from: [facility] Received Sig: BIOTIN, (Oral Capsule)  1 Every Day for 0 days  Quantity: 0.00;  Refills: 0   Ordered :15-Mar-2010  Manager, System ;  Started 18-Oct-2009 Active  . Calcium Carbonate-Vit D-Min (CALCIUM 1200 PO) 1 tablet daily. 09/15/2014: Received from: [facility] Received Sig:   . carvedilol (COREG) 12.5 MG tablet Take 1 tablet (12.5 mg total) by mouth 2 (two) times daily.   . folic acid (FOLVITE) 800 MCG tablet 1 tablet daily. 09/15/2014: Received from: [facility] Received Sig: FOLIC ACID, (Oral Tablet)  for 0 days  Quantity: 0.00;  Refills: 0   Ordered :15-Mar-2010  Encarnacion Chu ;  Started 12-May-2009 Active  . methotrexate 2.5 MG tablet 7 tablets once a week.  09/15/2014: Received from: [facility] Received Sig:   . Omega-3 Fatty Acids (FISH OIL BURP-LESS) 1000 MG CAPS Take 1 capsule by mouth daily.    Marland Kitchen triamterene-hydrochlorothiazide (  MAXZIDE-25) 37.5-25 MG tablet TAKE 1 TABLET BY MOUTH DAILY   . valACYclovir (VALTREX) 500 MG tablet Take 1 tablet (500 mg total) by mouth 2 (two)  times daily. As needed    No facility-administered encounter medications on file as of 01/25/2017.     No Known Allergies     Physical Exam  Constitutional: She is oriented to person, place, and time. She appears well-developed and well-nourished. No distress.  HENT:  Right Ear: External ear normal.  Left Ear: External ear normal.  Nose: Right sinus exhibits maxillary sinus tenderness and frontal sinus tenderness. Left sinus exhibits maxillary sinus tenderness and frontal sinus tenderness.  Mouth/Throat: Oropharynx is clear and moist. No oropharyngeal exudate, posterior oropharyngeal edema or posterior oropharyngeal erythema.  Tms opaque bilaterally   Eyes: Conjunctivae are normal. Right eye exhibits no discharge. Left eye exhibits no discharge.  Neck: Neck supple.  Cardiovascular: Normal rate and regular rhythm.  Pulmonary/Chest: Effort normal and breath sounds normal.  Lymphadenopathy:    She has cervical adenopathy.  Neurological: She is alert and oriented to person, place, and time.  Skin: Skin is warm and dry. She is not diaphoretic.  Psychiatric: She has a normal mood and affect. Her behavior is normal.       Assessment & Plan:  1. Acute non-recurrent maxillary sinusitis  - doxycycline (VIBRA-TABS) 100 MG tablet; Take 1 tablet (100 mg total) 2 (two) times daily for 10 days by mouth.  Dispense: 20 tablet; Refill: 0  Return if symptoms worsen or fail to improve.  The entirety of the information documented in the History of Present Illness, Review of Systems and Physical Exam were personally obtained by me. Portions of this information were initially documented by Kavin Leech, CMA and reviewed by me for thoroughness and accuracy.

## 2017-01-25 NOTE — Patient Instructions (Signed)

## 2017-02-01 ENCOUNTER — Encounter: Payer: Self-pay | Admitting: Physician Assistant

## 2017-02-01 ENCOUNTER — Ambulatory Visit (INDEPENDENT_AMBULATORY_CARE_PROVIDER_SITE_OTHER): Payer: BC Managed Care – PPO | Admitting: Physician Assistant

## 2017-02-01 VITALS — BP 138/88 | HR 76 | Temp 98.4°F | Resp 16 | Ht 64.0 in | Wt 154.0 lb

## 2017-02-01 DIAGNOSIS — I10 Essential (primary) hypertension: Secondary | ICD-10-CM

## 2017-02-01 DIAGNOSIS — K635 Polyp of colon: Secondary | ICD-10-CM | POA: Diagnosis not present

## 2017-02-01 DIAGNOSIS — D7589 Other specified diseases of blood and blood-forming organs: Secondary | ICD-10-CM

## 2017-02-01 DIAGNOSIS — B009 Herpesviral infection, unspecified: Secondary | ICD-10-CM

## 2017-02-01 DIAGNOSIS — H409 Unspecified glaucoma: Secondary | ICD-10-CM | POA: Diagnosis not present

## 2017-02-01 DIAGNOSIS — Z1231 Encounter for screening mammogram for malignant neoplasm of breast: Secondary | ICD-10-CM | POA: Diagnosis not present

## 2017-02-01 DIAGNOSIS — Z0000 Encounter for general adult medical examination without abnormal findings: Secondary | ICD-10-CM

## 2017-02-01 DIAGNOSIS — M059 Rheumatoid arthritis with rheumatoid factor, unspecified: Secondary | ICD-10-CM

## 2017-02-01 DIAGNOSIS — Z1239 Encounter for other screening for malignant neoplasm of breast: Secondary | ICD-10-CM

## 2017-02-01 DIAGNOSIS — E785 Hyperlipidemia, unspecified: Secondary | ICD-10-CM

## 2017-02-01 MED ORDER — TRIAMTERENE-HCTZ 37.5-25 MG PO TABS: 1.0000 | ORAL_TABLET | Freq: Every day | ORAL | 1 refills | Status: DC

## 2017-02-01 MED ORDER — CARVEDILOL 12.5 MG PO TABS: 12.5000 mg | ORAL_TABLET | Freq: Two times a day (BID) | ORAL | 1 refills | Status: DC

## 2017-02-01 NOTE — Patient Instructions (Signed)

## 2017-02-01 NOTE — Progress Notes (Signed)
Patient: Courtney Rodgers, Female    DOB: Jan 23, 1951, 66 y.o.   MRN: [id number] Visit Date: 02/01/2017  Today's Provider: Trey Sailors, PA-C   Chief Complaint  Patient presents with  . Annual Exam   Subjective:    Annual physical exam Courtney Rodgers is a 66 y.o. female who presents today for health maintenance and complete physical. She feels fairly well. She reports exercising regularly. She reports she is sleeping poorly.  Pt reports this is chronic.   Colonoscopy: 2015, due in 202 PAP/HPV: 2016, normal and negative Mammogram: 03/2016 normal DEXA: 2010, osteopenia CT Chest lung cancer screening: 02/2016 normal  She has a history of HTN. She is currently taking Coreg and maxzide. Occasionally misses morning doses because she likes to take with food and sometimes doesn't eat in the morning. Denies chest pain, blurred vision, ankle swelling.  She has glaucoma and is being followed by ophthalmology, using COmbigan and Latanoprost eye drops.  She is being treated for rheumatoid arthritis with methotrexate 6 pills once weekly or she may do three pills twice weekly. Sees Dr. Lavenia Atlas with rheumatology.  Declines bone density this year. Knows how to schedule mammogram.   Reports she has not had outbreak of HSV, not having to use Valtrex.   Got her flu shot at pharmacy. -----------------------------------------------------------------   Review of Systems  Constitutional: Negative.     Social History      She  reports that she quit smoking about 7 years ago. Her smoking use included cigarettes. She has a 32.00 pack-year smoking history. she has never used smokeless tobacco. She reports that she drinks about 4.2 oz of alcohol per week. She reports that she does not use drugs.       Social History   Socioeconomic History  . Marital status: Divorced    Spouse name: None  . Number of children: None  . Years of education: None  . Highest education level: None    Social Needs  . Financial resource strain: None  . Food insecurity - worry: None  . Food insecurity - inability: None  . Transportation needs - medical: None  . Transportation needs - non-medical: None  Occupational History  . None  Tobacco Use  . Smoking status: Former Smoker    Packs/day: 1.00    Years: 32.00    Pack years: 32.00    Types: Cigarettes    Last attempt to quit: 2011    Years since quitting: 7.8  . Smokeless tobacco: Never Used  . Tobacco comment: QUIT IN 2010  Substance and Sexual Activity  . Alcohol use: Yes    Alcohol/week: 4.2 oz    Types: 7 Glasses of wine per week  . Drug use: No  . Sexual activity: None  Other Topics Concern  . None  Social History Narrative  . None    Past Medical History:  Diagnosis Date  . Allergy   . Hyperlipidemia   . Hypertension      Patient Active Problem List   Diagnosis Date Noted  . Glaucoma 02/01/2017  . Personal history of tobacco use, presenting hazards to health 03/03/2016  . 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 Essential (primary) hypertension 09/15/2014  . Hypercholesteremia 09/15/2014  . Hyperplastic polyp of intestine 09/15/2014  . LBP (low back pain) 09/15/2014  . Osteopenia 09/15/2014  . Symptom associated with female genital organs 09/15/2014  . Flutter-fibrillation 09/15/2014  . Epistaxis 09/15/2014  . Inflammation of  sacroiliac joint (HCC) 09/15/2014  . Nonspecific abnormal finding in stool contents 09/15/2014  . 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 Carpal tunnel syndrome 09/15/2014  . Rheumatoid arthritis (HCC) 01/28/2014  . Arthritis or polyarthritis, rheumatoid (HCC) 05/12/2009  . Bone/cartilage disorder 05/12/2009    Past Surgical History:  Procedure Laterality Date  . APPENDECTOMY  1980  . CATARACT EXTRACTION Right   . TONSILLECTOMY  1960    Family History        Family Status  Relation Name Status  . Mother  Alive  . Father  Deceased  . Daughter  Alive   . Sister 1 Alive  . Brother 1 Alive  . Brother 2 Alive  . Brother 3 Alive  . Brother 4 Alive  . Sister 2 Alive  . Sister 3 Alive  . Neg Hx  (Not Specified)        Her family history includes Alcohol abuse in her father; CVA in her father; Congestive Heart Failure in her mother; Dementia in her mother; Diabetes in her mother; Drug abuse in her father; Emphysema in her father; Hypertension in her brother, brother, father, mother, sister, and sister; Seizures in her brother; Transient ischemic attack in her mother.     No Known Allergies   Current Outpatient Medications:  .  Aspirin 81 MG EC tablet, 1 tablet daily., Disp: , Rfl:  .  Biotin 5000 MCG CAPS, 1 capsule daily., Disp: , Rfl:  .  Calcium Carbonate-Vit D-Min (CALCIUM 1200 PO), 1 tablet daily., Disp: , Rfl:  .  carvedilol (COREG) 12.5 MG tablet, Take 1 tablet (12.5 mg total) 2 (two) times daily by mouth., Disp: 180 tablet, Rfl: 1 .  doxycycline (VIBRA-TABS) 100 MG tablet, Take 1 tablet (100 mg total) 2 (two) times daily for 10 days by mouth., Disp: 20 tablet, Rfl: 0 .  folic acid (FOLVITE) 800 MCG tablet, 1 tablet daily., Disp: , Rfl:  .  methotrexate 2.5 MG tablet, 7 tablets once a week. , Disp: , Rfl:  .  Omega-3 Fatty Acids (FISH OIL BURP-LESS) 1000 MG CAPS, Take 1 capsule by mouth daily. , Disp: , Rfl:  .  triamterene-hydrochlorothiazide (MAXZIDE-25) 37.5-25 MG tablet, Take 1 tablet daily by mouth., Disp: 90 tablet, Rfl: 1 .  valACYclovir (VALTREX) 500 MG tablet, Take 1 tablet (500 mg total) by mouth 2 (two) times daily. As needed, Disp: 20 tablet, Rfl: 5 .  azelastine (OPTIVAR) 0.05 % ophthalmic solution, INSTILL 1 DROP INTO BOTH EYES TWICE A DAY, Disp: , Rfl: 3 .  COMBIGAN 0.2-0.5 % ophthalmic solution, 1 DROP IN BOTH EYES TWICE A DAY, Disp: , Rfl: 6 .  latanoprost (XALATAN) 0.005 % ophthalmic solution, , Disp: , Rfl:    Patient Care Team: Maryella Shivers as PCP - General (Physician Assistant)      Objective:    Vitals: BP 138/88 (BP Location: Left Arm, Patient Position: Sitting, Cuff Size: Normal)   Pulse 76   Temp 98.4 F (36.9 C) (Oral)   Resp 16   Ht 5\' 4"  (1.626 m)   Wt 154 lb (69.9 kg)   BMI 26.43 kg/m    Vitals:   02/01/17 1010  BP: 138/88  Pulse: 76  Resp: 16  Temp: 98.4 F (36.9 C)  TempSrc: Oral  Weight: 154 lb (69.9 kg)  Height: 5\' 4"  (1.626 m)     Physical Exam  Constitutional: She is oriented to person, place, and time. She appears well-developed and well-nourished. No distress.  HENT:  Right Ear: Tympanic membrane and external ear normal.  Left Ear: Tympanic membrane and external ear normal.  Nose: Nose normal.  Mouth/Throat: Oropharynx is clear and moist. No oropharyngeal exudate.  Eyes: Conjunctivae are normal.  Neck: Neck supple.  Cardiovascular: Normal rate and regular rhythm.  Pulmonary/Chest: Effort normal and breath sounds normal. Right breast exhibits inverted nipple. Right breast exhibits no mass, no nipple discharge, no skin change and no tenderness. Left breast exhibits inverted nipple. Left breast exhibits no mass, no nipple discharge, no skin change and no tenderness. Breasts are symmetrical.  Abdominal: Soft. Bowel sounds are normal.  Musculoskeletal: She exhibits no edema.  Lymphadenopathy:    She has no cervical adenopathy.  Neurological: She is alert and oriented to person, place, and time.  Skin: Skin is warm and dry.  Psychiatric: She has a normal mood and affect. Her behavior is normal.     Depression Screen PHQ 2/9 Scores 12/21/2014  PHQ - 2 Score 0      Assessment & Plan:     Routine Health Maintenance and Physical Exam  Exercise Activities and Dietary recommendations Goals    . Exercise 150 minutes per week (moderate activity)       Immunization History  Administered Date(s) Administered  . Influenza Split 02/01/2011  . Influenza,inj,Quad PF,6+ Mos 12/21/2014  . Tdap 10/18/2009    Health Maintenance  Topic Date Due   . PAP SMEAR  12/20/2017  . MAMMOGRAM  03/22/2018  . TETANUS/TDAP  10/19/2019  . COLONOSCOPY  12/26/2023  . INFLUENZA VACCINE  Completed  . Hepatitis C Screening  Completed  . HIV Screening  Completed     Discussed health benefits of physical activity, and encouraged her to engage in regular exercise appropriate for her age and condition.    1. Annual physical exam   2. Essential (primary) hypertension  Continue medications, refilled x 1 year.  - Comprehensive Metabolic Panel (CMET)  3. Rheumatoid arthritis with positive rheumatoid factor, involving unspecified site [facility])  On methotrexate, followed by rheumatology.   4. Hyperplastic polyp of intestine  Due colonoscopy 2020.  5. HSV-2 (herpes simplex virus 2) infection  No outbreaks.  6. Glaucoma, unspecified glaucoma type, unspecified laterality  Continue drops and following ophthalmology.  7. Macrocytosis without anemia  - CBC with Differential  8. Hyperlipidemia, unspecified hyperlipidemia type  - Lipid Profile  9. Breast cancer screening  May continue screening. Get DEXA next year. Do Calcium and vitamin D supplement.   - MM Digital Screening; Future  10. Essential hypertension  - carvedilol (COREG) 12.5 MG tablet; Take 1 tablet (12.5 mg total) 2 (two) times daily by mouth.  Dispense: 180 tablet; Refill: 1 - triamterene-hydrochlorothiazide (MAXZIDE-25) 37.5-25 MG tablet; Take 1 tablet daily by mouth.  Dispense: 90 tablet; Refill: 1  Return in about 1 year (around 02/01/2018) for initial medicare wellness.  The entirety of the information documented in the History of Present Illness, Review of Systems and Physical Exam were personally obtained by me. Portions of this information were initially documented by Kavin Leech, CMA and reviewed by me for thoroughness and accuracy.   ------------------------------------------------------------------    Trey Sailors, PA-C  [facility]

## 2017-02-02 LAB — COMPLETE METABOLIC PANEL WITH GFR
AG Ratio: 1.7 (calc) (ref 1.0–2.5)
ALT: 14 U/L (ref 6–29)
AST: 19 U/L (ref 10–35)
Albumin: 4.7 g/dL (ref 3.6–5.1)
Alkaline phosphatase (APISO): 61 U/L (ref 33–130)
BUN: 18 mg/dL (ref 7–25)
CO2: 30 mmol/L (ref 20–32)
Calcium: 9.5 mg/dL (ref 8.6–10.4)
Chloride: 100 mmol/L (ref 98–110)
Creat: 0.88 mg/dL (ref 0.50–0.99)
GFR, Est African American: 80 mL/min/{1.73_m2} (ref >=60)
GFR, Est Non African American: 69 mL/min/{1.73_m2} (ref >=60)
Globulin: 2.8 g/dL (calc) (ref 1.9–3.7)
Glucose, Bld: 79 mg/dL (ref 65–99)
Potassium: 3.9 mmol/L (ref 3.5–5.3)
Sodium: 141 mmol/L (ref 135–146)
Total Bilirubin: 0.7 mg/dL (ref 0.2–1.2)
Total Protein: 7.5 g/dL (ref 6.1–8.1)

## 2017-02-02 LAB — LIPID PANEL
Cholesterol: 241 mg/dL — ABNORMAL HIGH (ref <200)
HDL: 108 mg/dL (ref >50)
LDL Cholesterol (Calc): 113 mg/dL (calc) — ABNORMAL HIGH
Non-HDL Cholesterol (Calc): 133 mg/dL (calc) — ABNORMAL HIGH (ref <130)
Total CHOL/HDL Ratio: 2.2 (calc) (ref <5.0)
Triglycerides: 98 mg/dL (ref <150)

## 2017-02-02 LAB — CBC WITH DIFFERENTIAL/PLATELET
Basophils Absolute: 82 cells/uL (ref 0–200)
Basophils Relative: 1 %
Eosinophils Absolute: 205 cells/uL (ref 15–500)
Eosinophils Relative: 2.5 %
HCT: 42.7 % (ref 35.0–45.0)
Hemoglobin: 14.7 g/dL (ref 11.7–15.5)
Lymphs Abs: 2247 cells/uL (ref 850–3900)
MCH: 33.9 pg — ABNORMAL HIGH (ref 27.0–33.0)
MCHC: 34.4 g/dL (ref 32.0–36.0)
MCV: 98.4 fL (ref 80.0–100.0)
MPV: 10.8 fL (ref 7.5–12.5)
Monocytes Relative: 10.1 %
Neutro Abs: 4838 cells/uL (ref 1500–7800)
Neutrophils Relative %: 59 %
Platelets: 222 10*3/uL (ref 140–400)
RBC: 4.34 10*6/uL (ref 3.80–5.10)
RDW: 11.9 % (ref 11.0–15.0)
Total Lymphocyte: 27.4 %
WBC mixed population: 828 cells/uL (ref 200–950)
WBC: 8.2 10*3/uL (ref 3.8–10.8)

## 2017-02-05 ENCOUNTER — Telehealth: Payer: Self-pay

## 2017-02-05 DIAGNOSIS — E7800 Pure hypercholesterolemia, unspecified: Secondary | ICD-10-CM

## 2017-02-05 NOTE — Telephone Encounter (Signed)
-----   Message from Trey Sailors, [place] sent at 02/05/2017  3:59 PM EST ----- Cholesterol increased from last year. 10 year CVD risk 10.1%, indicates statin. Statins are cholesterol lowering drugs that reduce risk of death from heart disease. Her remaining labwork is normal. If patient wishes to start a statin, I can send one in and we can have follow up visit in 6 mo.

## 2017-02-05 NOTE — Telephone Encounter (Signed)
Advised patient of results. Patient is willing to start a STATIN. Patient uses [facility] in [place]. Thanks!

## 2017-02-06 MED ORDER — ATORVASTATIN CALCIUM 10 MG PO TABS: 10.0000 mg | ORAL_TABLET | Freq: Every day | ORAL | 1 refills | Status: DC

## 2017-02-06 NOTE — Telephone Encounter (Signed)
Sent in atorvastatin 10 mg once nightly. May cause some GI upset, joint pain, muscle aches. These are normal side effects, but if they become severe, they should be assessed. Please have her schedule follow up visit in 6 months where we can recheck cholesterol.

## 2017-02-20 ENCOUNTER — Telehealth: Payer: Self-pay | Admitting: *Deleted

## 2017-02-20 DIAGNOSIS — Z87891 Personal history of nicotine dependence: Secondary | ICD-10-CM

## 2017-02-20 DIAGNOSIS — Z122 Encounter for screening for malignant neoplasm of respiratory organs: Secondary | ICD-10-CM

## 2017-02-20 NOTE — Telephone Encounter (Signed)
Notified patient that annual lung cancer screening low dose CT scan is due currently or will be in near future. Confirmed that patient is within the age range of 55-77, and asymptomatic, (no signs or symptoms of lung cancer). Patient denies illness that would prevent curative treatment for lung cancer if found. Verified smoking history, (former, quit 2011, 32 pack year). The shared decision making visit was done 02/29/16. Patient is agreeable for CT scan being scheduled.

## 2017-02-28 ENCOUNTER — Ambulatory Visit: Payer: BC Managed Care – PPO

## 2017-03-07 ENCOUNTER — Telehealth: Payer: Self-pay | Admitting: *Deleted

## 2017-03-07 DIAGNOSIS — Z122 Encounter for screening for malignant neoplasm of respiratory organs: Secondary | ICD-10-CM

## 2017-03-07 DIAGNOSIS — Z87891 Personal history of nicotine dependence: Secondary | ICD-10-CM

## 2017-03-07 NOTE — Telephone Encounter (Signed)
Contacted patient regarding rescheduling lung screening scan. Patient is agreeable for rescheduling in January. Please see prior note regarding eligibility information.

## 2017-03-27 ENCOUNTER — Ambulatory Visit: Admission: RE | Admit: 2017-03-27 | Payer: BC Managed Care – PPO | Source: Ambulatory Visit

## 2017-03-30 ENCOUNTER — Ambulatory Visit: Admission: RE | Admit: 2017-03-30 | Payer: BC Managed Care – PPO | Source: Ambulatory Visit

## 2017-04-02 ENCOUNTER — Ambulatory Visit
Admission: RE | Admit: 2017-04-02 | Discharge: 2017-04-02 | Disposition: A | Discharge status: Home / Self Care | Payer: BC Managed Care – PPO | Source: Ambulatory Visit | Attending: Nurse Practitioner | Admitting: Nurse Practitioner

## 2017-04-02 DIAGNOSIS — I700 Atherosclerosis of aorta: Secondary | ICD-10-CM | POA: Insufficient documentation

## 2017-04-02 DIAGNOSIS — Z87891 Personal history of nicotine dependence: Secondary | ICD-10-CM

## 2017-04-02 DIAGNOSIS — Z122 Encounter for screening for malignant neoplasm of respiratory organs: Secondary | ICD-10-CM

## 2017-04-09 ENCOUNTER — Encounter: Payer: Self-pay | Admitting: *Deleted

## 2017-05-23 IMAGING — CT CT CHEST LUNG CANCER SCREENING LOW DOSE W/O CM
1 of 2 series · 15 of 40 positions shown, 19 images · non-contrast
Comparison: None.

CLINICAL DATA: Lung cancer screening. Former asymptomatic smoker.
Thirty-two pack-year history.

EXAM:
CT CHEST WITHOUT CONTRAST LOW-DOSE FOR LUNG CANCER SCREENING
TECHNIQUE: Multidetector CT imaging of the chest was performed following the
standard protocol without IV contrast.

[Series 2: axial st · axial · 0.66mm/px · z∈[-592,-348]mm · 15 of 55 slices shown, 19 images]
[im 3/55  mediastinal]
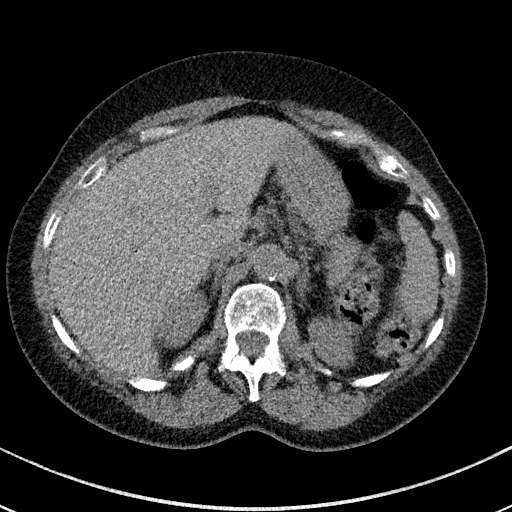
[im 3/55  lung]
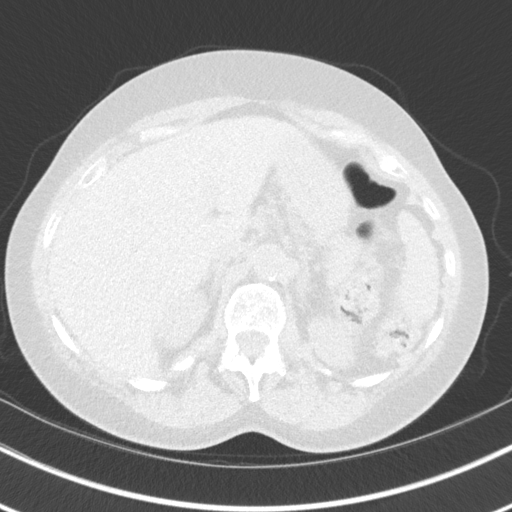
[im 7/55  lung]
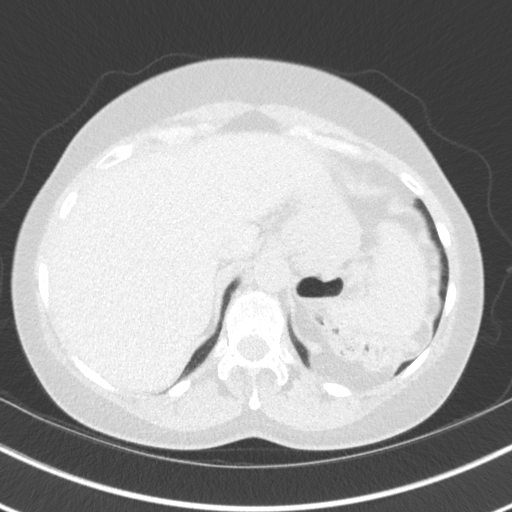
[im 11/55  lung]
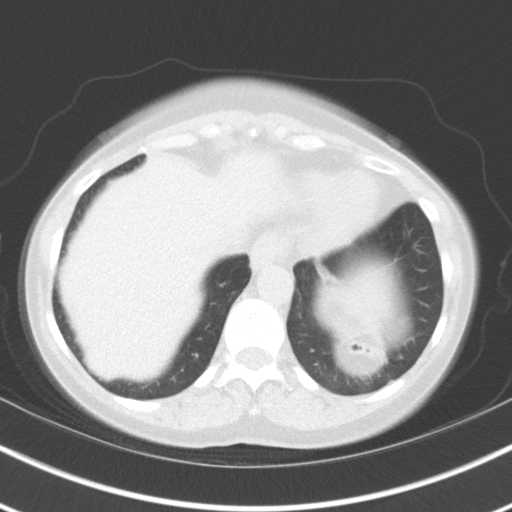
[im 14/55  lung]
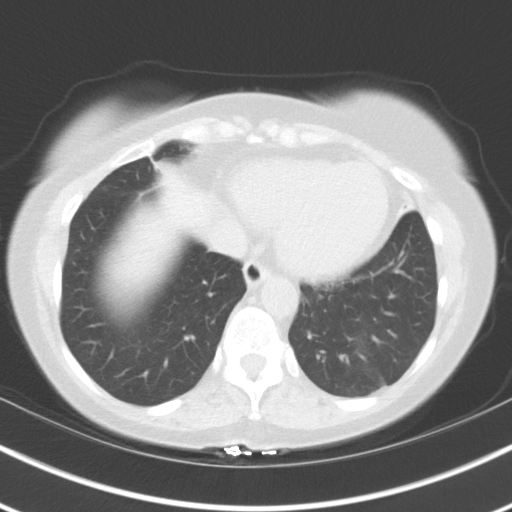
[im 17/55  mediastinal]
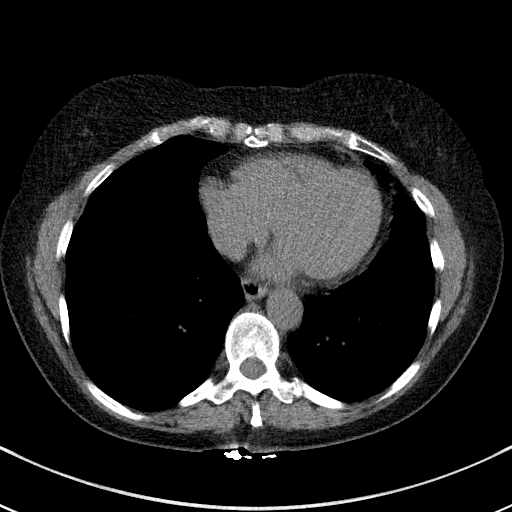
[im 17/55  lung]
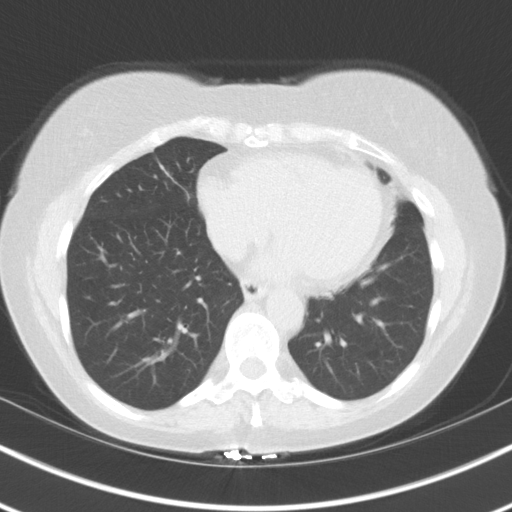
[im 21/55  lung]
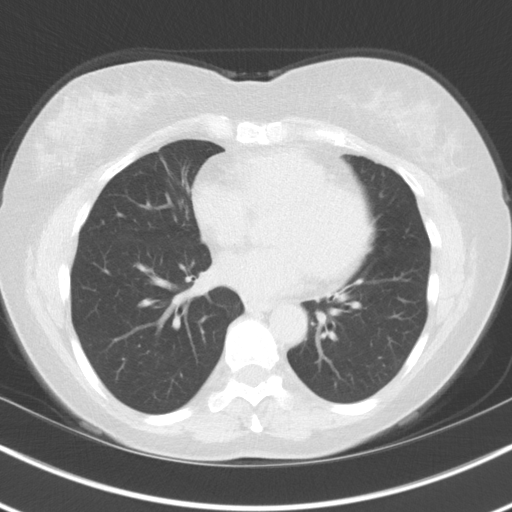
[im 25/55  lung]
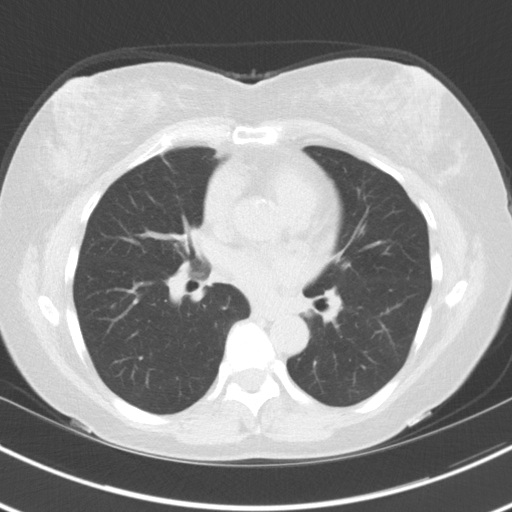
[im 28/55  lung]
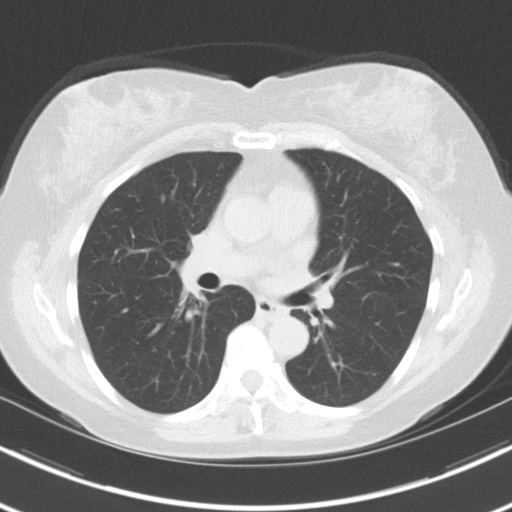
[im 30/55  mediastinal]
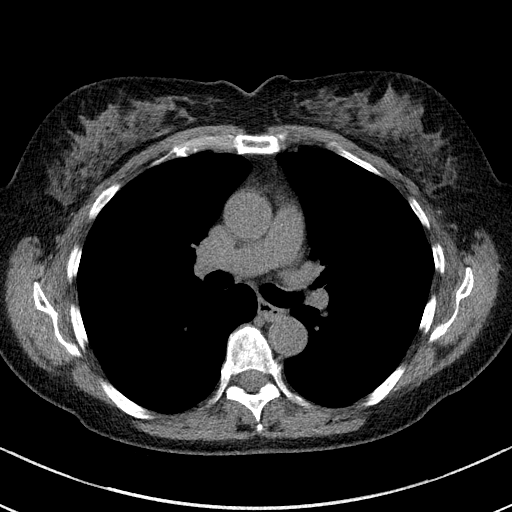
[im 30/55  lung]
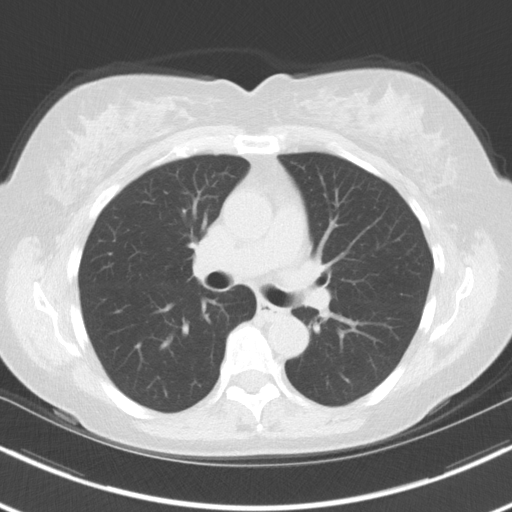
[im 34/55  lung]
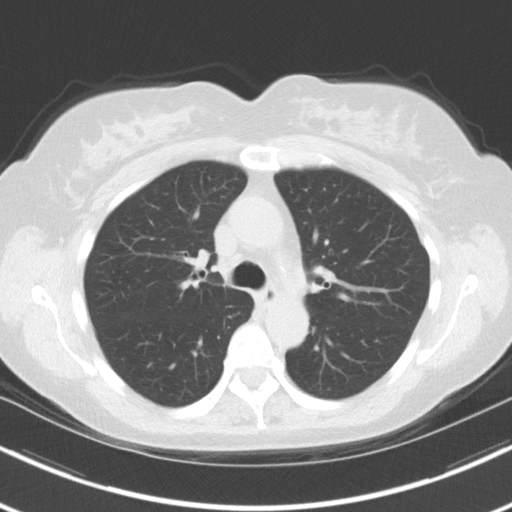
[im 38/55  lung]
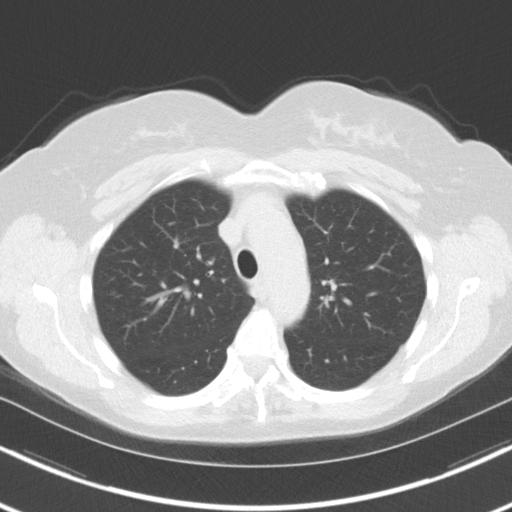
[im 41/55  lung]
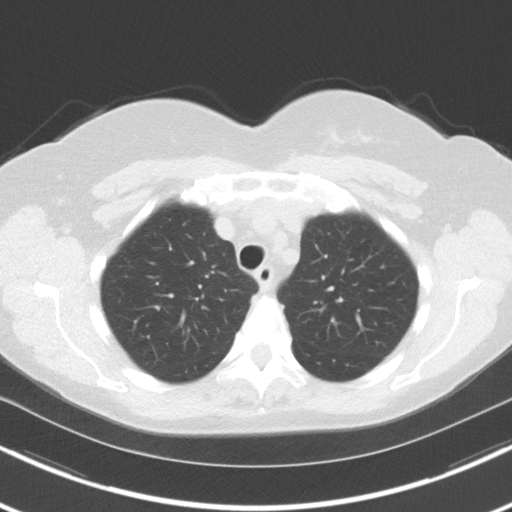
[im 44/55  mediastinal]
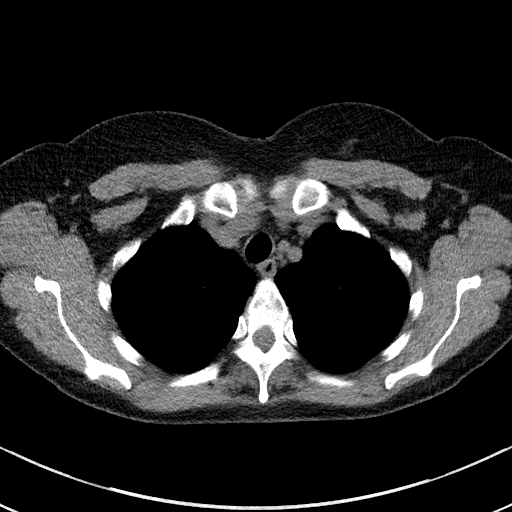
[im 44/55  lung]
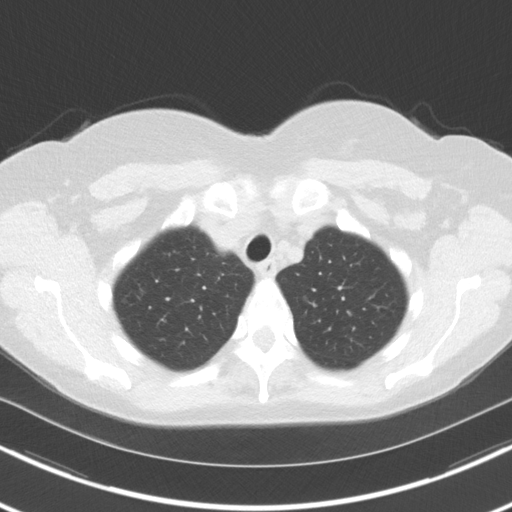
[im 48/55  lung]
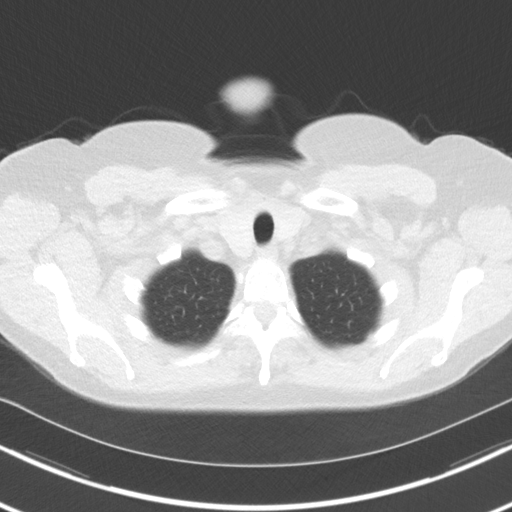
[im 52/55  lung]
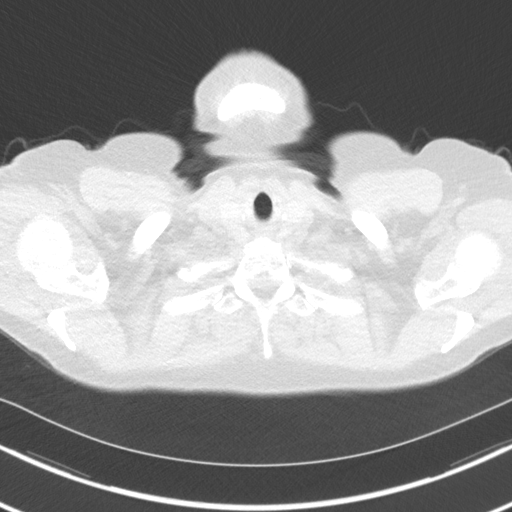

[15 of 40 positions shown; findings below may reference images not displayed]

FINDINGS: Cardiovascular: Normal heart size.  Aortic atherosclerosis.

Mediastinum/Nodes: No enlarged mediastinal, hilar, or axillary lymph
nodes. Thyroid gland, trachea, and esophagus demonstrate no
significant findings.

Lungs/Pleura: Mild changes of centrilobular emphysema. Diffuse
bronchial wall thickening noted. Perifissural nodule within the
basilar right upper lobe has an equivalent diameter of 3.5 mm, 136
of series 3.

Upper Abdomen: No acute abnormality.

Musculoskeletal: There is mild degenerative disc disease identified.
No aggressive lytic or sclerotic bone lesions identified.
IMPRESSION: 1. Lung-RADS Category 2, benign appearance or behavior. Continue
annual screening with low-dose chest CT without contrast in 12
months
2. Aortic atherosclerosis.

## 2017-07-24 ENCOUNTER — Ambulatory Visit
Admission: RE | Admit: 2017-07-24 | Discharge: 2017-07-24 | Disposition: A | Discharge status: Home / Self Care | Payer: Medicare Other | Source: Ambulatory Visit | Attending: Physician Assistant | Admitting: Physician Assistant

## 2017-07-24 DIAGNOSIS — Z1239 Encounter for other screening for malignant neoplasm of breast: Secondary | ICD-10-CM

## 2017-07-24 DIAGNOSIS — Z1231 Encounter for screening mammogram for malignant neoplasm of breast: Secondary | ICD-10-CM | POA: Insufficient documentation

## 2017-08-06 ENCOUNTER — Other Ambulatory Visit: Payer: Self-pay | Admitting: Physician Assistant

## 2017-08-06 DIAGNOSIS — E7800 Pure hypercholesterolemia, unspecified: Secondary | ICD-10-CM

## 2017-08-07 NOTE — Telephone Encounter (Signed)
Needs follow up to assess lipids. Please have her schedule before more refill.

## 2017-08-15 NOTE — Telephone Encounter (Signed)
Left message advising pt.  Please schedule an office visit when she calls back.   Thanks,   -Vernona Rieger

## 2017-08-22 ENCOUNTER — Ambulatory Visit: Payer: Medicare Other | Admitting: Physician Assistant

## 2017-08-22 ENCOUNTER — Encounter: Payer: Self-pay | Admitting: Physician Assistant

## 2017-08-22 VITALS — BP 110/70 | HR 67 | Temp 97.5°F | Wt 149.2 lb

## 2017-08-22 DIAGNOSIS — E7800 Pure hypercholesterolemia, unspecified: Secondary | ICD-10-CM | POA: Diagnosis not present

## 2017-08-22 DIAGNOSIS — B009 Herpesviral infection, unspecified: Secondary | ICD-10-CM

## 2017-08-22 DIAGNOSIS — Z23 Encounter for immunization: Secondary | ICD-10-CM | POA: Diagnosis not present

## 2017-08-22 MED ORDER — VALACYCLOVIR HCL 1 G PO TABS: 1000.0000 mg | ORAL_TABLET | Freq: Every day | ORAL | 0 refills | Status: DC

## 2017-08-22 NOTE — Patient Instructions (Signed)
Cholesterol Cholesterol is a fat. Your body needs a small amount of cholesterol. Cholesterol (plaque) may build up in your blood vessels (arteries). That makes you more likely to have a heart attack or stroke. You cannot feel your cholesterol level. Having a blood test is the only way to find out if your level is high. Keep your test results. Work with your doctor to keep your cholesterol at a good level. What do the results mean?  Total cholesterol is how much cholesterol is in your blood.  LDL is bad cholesterol. This is the type that can build up. Try to have low LDL.  HDL is good cholesterol. It cleans your blood vessels and carries LDL away. Try to have high HDL.  Triglycerides are fat that the body can store or burn for energy. What are good levels of cholesterol?  Total cholesterol below 200.  LDL below 100 is good for people who have health risks. LDL below 70 is good for people who have very high risks.  HDL above 40 is good. It is best to have HDL of 60 or higher.  Triglycerides below 150. How can I lower my cholesterol? Diet Follow your diet program as told by your doctor.  Choose fish, white meat chicken, or turkey that is roasted or baked. Try not to eat red meat, fried foods, sausage, or lunch meats.  Eat lots of fresh fruits and vegetables.  Choose whole grains, beans, pasta, potatoes, and cereals.  Choose olive oil, corn oil, or canola oil. Only use small amounts.  Try not to eat butter, mayonnaise, shortening, or palm kernel oils.  Try not to eat foods with trans fats.  Choose low-fat or nonfat dairy foods. ? Drink skim or nonfat milk. ? Eat low-fat or nonfat yogurt and cheeses. ? Try not to drink whole milk or cream. ? Try not to eat ice cream, egg yolks, or full-fat cheeses.  Healthy desserts include angel food cake, ginger snaps, animal crackers, hard candy, popsicles, and low-fat or nonfat frozen yogurt. Try not to eat pastries, cakes, pies, and  cookies.  Exercise Follow your exercise program as told by your doctor.  Be more active. Try gardening, walking, and taking the stairs.  Ask your doctor about ways that you can be more active.  Medicine  Take over-the-counter and prescription medicines only as told by your doctor. This information is not intended to replace advice given to you by your health care provider. Make sure you discuss any questions you have with your health care provider. Document Released: 06/02/2008 Document Revised: 10/06/2015 Document Reviewed: 09/16/2015 Elsevier Interactive Patient Education  2018 [employer]  

## 2017-08-22 NOTE — Progress Notes (Signed)
Patient: Courtney Rodgers Female    DOB: 11-24-1950   67 y.o.   MRN: [id number] Visit Date: 08/22/2017  Today's Provider: Trinna Post, PA-C   Chief Complaint  Patient presents with  . Hyperlipidemia  . Follow-up   Subjective:    HPI  Lipid/Cholesterol, Follow-up:   Last seen for this 6 months ago.  Management changes since that visit include labs showed elevated Cholesterol. Patient started Atorvastatin 10 mg and advised to follow up in 6 months to recheck labs.  Last Lipid Panel:    Component Value Date/Time   CHOL 241 (H) 02/02/2017 1009   CHOL 236 (H) 02/03/2016 1003   TRIG 98 02/02/2017 1009   HDL 108 02/02/2017 1009   HDL 99 02/03/2016 1003   CHOLHDL 2.2 02/02/2017 1009   LDLCALC 113 (H) 02/02/2017 1009    Risk factors for vascular disease include hypercholesterolemia and hypertension  She reports good compliance with treatment. She is not having side effects.  Current symptoms include none  Weight trend: stable Prior visit with dietician: no Current diet: in general, a "healthy" diet   Current exercise: walking, lifting weights and arobics 5 days per week  Wt Readings from Last 3 Encounters:  08/22/17 149 lb 3.2 oz (67.7 kg)  04/02/17 150 lb (68 kg)  02/01/17 154 lb (69.9 kg)   HSV2  Patient was diagnosed with HSV 2 lesion on buttock several years by Dr. Venia Minks. This has caused severe distress and depressive symptoms for her. She describes guilt, shame, and feelings of isolation. She has struggled with this since her diagnosis. For some time, she even stopped water aerobics for fear of spreading the virus, but she has since resumed this. She has met somebody that she thinks she may be sexually active with and wants to know more about this.  ------------------------------------------------------------------- No Known Allergies   Current Outpatient Medications:  .  Aspirin 81 MG EC tablet, 1 tablet daily., Disp: , Rfl:  .  atorvastatin (LIPITOR) 10  MG tablet, TAKE 1 TABLET BY MOUTH EVERY DAY, Disp: 30 tablet, Rfl: 0 .  azelastine (OPTIVAR) 0.05 % ophthalmic solution, INSTILL 1 DROP INTO BOTH EYES TWICE A DAY, Disp: , Rfl: 3 .  Biotin 5000 MCG CAPS, 1 capsule daily., Disp: , Rfl:  .  Biotin 5000 MCG CAPS, Take by mouth., Disp: , Rfl:  .  Calcium Carbonate-Vit D-Min (CALCIUM 1200 PO), 1 tablet daily., Disp: , Rfl:  .  carvedilol (COREG) 12.5 MG tablet, Take 1 tablet (12.5 mg total) 2 (two) times daily by mouth., Disp: 180 tablet, Rfl: 1 .  COMBIGAN 0.2-0.5 % ophthalmic solution, 1 DROP IN BOTH EYES TWICE A DAY, Disp: , Rfl: 6 .  folic acid (FOLVITE) 093 MCG tablet, 1 tablet daily., Disp: , Rfl:  .  latanoprost (XALATAN) 0.005 % ophthalmic solution, , Disp: , Rfl:  .  methotrexate 2.5 MG tablet, 7 tablets once a week. , Disp: , Rfl:  .  Omega-3 Fatty Acids (FISH OIL BURP-LESS) 1000 MG CAPS, Take 1 capsule by mouth daily. , Disp: , Rfl:  .  triamterene-hydrochlorothiazide (MAXZIDE-25) 37.5-25 MG tablet, Take 1 tablet daily by mouth., Disp: 90 tablet, Rfl: 1 .  valACYclovir (VALTREX) 1000 MG tablet, Take 1 tablet (1,000 mg total) by mouth daily., Disp: 90 tablet, Rfl: 0  Review of Systems  Constitutional: Negative.   Respiratory: Negative.   Cardiovascular: Negative.   Musculoskeletal: Negative.     Social History   Tobacco Use  .  Smoking status: Former Smoker    Packs/day: 1.00    Years: 32.00    Pack years: 32.00    Types: Cigarettes    Last attempt to quit: 2011    Years since quitting: 8.4  . Smokeless tobacco: Never Used  . Tobacco comment: QUIT IN 2010  Substance Use Topics  . Alcohol use: Yes    Alcohol/week: 4.2 oz    Types: 7 Glasses of wine per week   Objective:   BP 110/70 (BP Location: Left Arm, Patient Position: Sitting, Cuff Size: Normal)   Pulse 67   Temp (!) 97.5 F (36.4 C) (Oral)   Wt 149 lb 3.2 oz (67.7 kg)   SpO2 99%   BMI 25.61 kg/m     Physical Exam  Constitutional: She is oriented to person,  place, and time. She appears well-developed and well-nourished.  Cardiovascular: Normal rate and regular rhythm.  Pulmonary/Chest: Effort normal and breath sounds normal.  Neurological: She is alert and oriented to person, place, and time.  Skin: Skin is warm and dry.  Psychiatric: She has a normal mood and affect. Her behavior is normal.        Assessment & Plan:     1. Hypercholesteremia  Recheck lipid panel. Tolerating Statin well.  - Lipid Profile  2. Need for pneumococcal vaccination  Updated today.   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 Will initiate medication below. Counseled on transmission. Technically, people may spread virus with no symptoms. Counseled on avoiding sexual activities during outbreak, using condom every sexual encounter, and taking daily antiviral to have lowest possible chance of transmission.  - valACYclovir (VALTREX) 1000 MG tablet; Take 1 tablet (1,000 mg total) by mouth daily.  Dispense: 90 tablet; Refill: 0  Return in about 5 months (around 01/22/2018) for welcome to medicare.  The entirety of the information documented in the History of Present Illness, Review of Systems and Physical Exam were personally obtained by me. Portions of this information were initially documented by Tiburcio Pea, CMA and reviewed by me for thoroughness and accuracy.        Trinna Post, PA-C  [facility]

## 2017-08-23 ENCOUNTER — Telehealth: Payer: Self-pay

## 2017-08-23 LAB — LIPID PANEL
Chol/HDL Ratio: 2.1 ratio (ref 0.0–4.4)
Cholesterol, Total: 182 mg/dL (ref 100–199)
HDL: 85 mg/dL (ref >39)
LDL Calculated: 78 mg/dL (ref 0–99)
Triglycerides: 96 mg/dL (ref 0–149)
VLDL Cholesterol Cal: 19 mg/dL (ref 5–40)

## 2017-08-23 NOTE — Telephone Encounter (Signed)
-----   Message from Trey Sailors, [place] sent at 08/23/2017  8:36 AM EDT ----- Cholesterol improved. Continue cholesterol medication.

## 2017-08-23 NOTE — Telephone Encounter (Signed)
Pt advised.   Thanks,   -Josely Moffat  

## 2017-09-10 ENCOUNTER — Ambulatory Visit (INDEPENDENT_AMBULATORY_CARE_PROVIDER_SITE_OTHER): Payer: Medicare Other | Admitting: Family Medicine

## 2017-09-10 ENCOUNTER — Encounter: Payer: Self-pay | Admitting: Family Medicine

## 2017-09-10 ENCOUNTER — Ambulatory Visit
Admission: RE | Admit: 2017-09-10 | Discharge: 2017-09-10 | Disposition: A | Discharge status: Home / Self Care | Payer: Medicare Other | Source: Ambulatory Visit | Attending: Family Medicine | Admitting: Family Medicine

## 2017-09-10 VITALS — BP 134/80 | HR 63 | Temp 98.1°F | Resp 16 | Wt 151.0 lb

## 2017-09-10 DIAGNOSIS — S20211A Contusion of right front wall of thorax, initial encounter: Secondary | ICD-10-CM

## 2017-09-10 DIAGNOSIS — S2231XA Fracture of one rib, right side, initial encounter for closed fracture: Secondary | ICD-10-CM | POA: Diagnosis not present

## 2017-09-10 DIAGNOSIS — X58XXXA Exposure to other specified factors, initial encounter: Secondary | ICD-10-CM | POA: Diagnosis not present

## 2017-09-10 NOTE — Progress Notes (Signed)
  Subjective:     Patient ID: Courtney Rodgers, female   DOB: [birth year], 67 y.o.   MRN: [id number] Chief Complaint  Patient presents with  . Fall    Patient comes in office today with concerns of hip pain after fall on 09/07/17. Patient states that she was walking down basement steps in the dark and missed a step and fell hitting side of her hip on a pieced of furniture and landing on her behind. Patient reports pain in her right hip radiating across right side of back.    HPI States she was two steps from the basement flood which was carpeted concrete. Mainly concerned about right chest wall bruising but no shortness of breath. Review of Systems     Objective:   Physical Exam  Constitutional: She appears well-developed and well-nourished. No distress.  Pulmonary/Chest: Breath sounds normal. She exhibits tenderness (right lateral chest wall with small area of ecchymosis; no crepitus).       Assessment:    1. Chest wall contusion, right, initial encounter; patient wishes further evaluation with x-ray. - DG Ribs Unilateral Right; Future    Plan:    Discussed use of Tylenol and continued cold compresses pending x-ray results.

## 2017-09-10 NOTE — Patient Instructions (Signed)
Discussed scheduling Tylenol up to 3000 mg/day. Continue Cold compresses for 20 minutes 2-3 x day. We will call you with the x-ray report.

## 2017-09-11 ENCOUNTER — Other Ambulatory Visit: Payer: Self-pay | Admitting: Physician Assistant

## 2017-09-11 DIAGNOSIS — E7800 Pure hypercholesterolemia, unspecified: Secondary | ICD-10-CM

## 2017-11-18 ENCOUNTER — Other Ambulatory Visit: Payer: Self-pay | Admitting: Physician Assistant

## 2017-11-18 DIAGNOSIS — B009 Herpesviral infection, unspecified: Secondary | ICD-10-CM

## 2017-12-11 ENCOUNTER — Other Ambulatory Visit: Payer: Self-pay | Admitting: Physician Assistant

## 2017-12-11 DIAGNOSIS — E7800 Pure hypercholesterolemia, unspecified: Secondary | ICD-10-CM

## 2018-02-20 NOTE — Progress Notes (Signed)
Patient: Courtney Rodgers, Female    DOB: Dec 03, 1950, 67 y.o.   MRN: [id number] Visit Date: 02/26/2018  Today's Provider: Trey Sailors, PA-C   Chief Complaint  Patient presents with  . Medicare Wellness   Subjective:    Annual wellness visit Courtney Rodgers is a 67 y.o. female. She feels well. She reports exercising. She reports she is sleeping well.  Prevnar: 08/22/2017  Colonoscopy: 2015 several polyps, pathology unknown - [facility] Dr. Marva Panda DEXA: due Mammo: 07/25/2017 normal PAP: 2016 normal and negative HPV - patient would like to repeat one today   HLD: Currently taking 10 mg lipitor daily without issue.  Lipid Panel     Component Value Date/Time   CHOL 189 02/21/2018 1121   TRIG 112 02/21/2018 1121   HDL 85 02/21/2018 1121   CHOLHDL 2.2 02/21/2018 1121   CHOLHDL 2.2 02/02/2017 1009   LDLCALC 82 02/21/2018 1121   LDLCALC 113 (H) 02/02/2017 1009   HTN: Maxzide 37.5-25 mg daily without issue. Also uses coreg 12.5 mg BID.   Rheumatoid Arthritis: Sees rheumatology, takes methotrexate.  -----------------------------------------------------------   Review of Systems  Constitutional: Negative.   HENT: Negative.   Eyes: Positive for redness and itching.  Respiratory: Negative.   Cardiovascular: Negative.   Gastrointestinal: Negative.   Endocrine: Negative.   Genitourinary: Negative.   Musculoskeletal: Negative.   Skin: Negative.   Allergic/Immunologic: Negative.   Neurological: Negative.   Hematological: Negative.   Psychiatric/Behavioral: Negative.    REQUEST PATHOLOGY FOR COLONOSCOPY   Social History   Socioeconomic History  . Marital status: Divorced    Spouse name: Not on file  . Number of children: Not on file  . Years of education: Not on file  . Highest education level: Not on file  Occupational History  . Not on file  Social Needs  . Financial resource strain: Not on file  . Food insecurity:    Worry: Not on file     Inability: Not on file  . Transportation needs:    Medical: Not on file    Non-medical: Not on file  Tobacco Use  . Smoking status: Former Smoker    Packs/day: 1.00    Years: 32.00    Pack years: 32.00    Types: Cigarettes    Last attempt to quit: 2011    Years since quitting: 8.9  . Smokeless tobacco: Never Used  . Tobacco comment: QUIT IN 2010  Substance and Sexual Activity  . Alcohol use: Yes    Alcohol/week: 7.0 standard drinks    Types: 7 Glasses of wine per week  . Drug use: No  . Sexual activity: Not on file  Lifestyle  . Physical activity:    Days per week: Not on file    Minutes per session: Not on file  . Stress: Not on file  Relationships  . Social connections:    Talks on phone: Not on file    Gets together: Not on file    Attends religious service: Not on file    Active member of club or organization: Not on file    Attends meetings of clubs or organizations: Not on file    Relationship status: Not on file  . Intimate partner violence:    Fear of current or ex partner: Not on file    Emotionally abused: Not on file    Physically abused: Not on file    Forced sexual activity: Not on file  Other  Topics Concern  . Not on file  Social History Narrative  . Not on file    Past Medical History:  Diagnosis Date  . Allergy   . Hyperlipidemia   . Hypertension      Patient Active Problem List   Diagnosis Date Noted  . Glaucoma 02/01/2017  . Personal history of tobacco use, presenting hazards to health 03/03/2016  . 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 Essential (primary) hypertension 09/15/2014  . Hypercholesteremia 09/15/2014  . Hyperplastic polyp of intestine 09/15/2014  . LBP (low back pain) 09/15/2014  . Osteopenia 09/15/2014  . Symptom associated with female genital organs 09/15/2014  . Flutter-fibrillation (HCC) 09/15/2014  . Epistaxis 09/15/2014  . 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 Nonspecific abnormal  finding in stool contents 09/15/2014  . 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 Carpal tunnel syndrome 09/15/2014  . Rheumatoid arthritis (HCC) 01/28/2014  . Arthritis or polyarthritis, rheumatoid (HCC) 05/12/2009  . Bone/cartilage disorder 05/12/2009    Past Surgical History:  Procedure Laterality Date  . APPENDECTOMY  1980  . CATARACT EXTRACTION Right   . TONSILLECTOMY  1960    Her family history includes Alcohol abuse in her father; CVA in her father; Congestive Heart Failure in her mother; Dementia in her mother; Diabetes in her mother; Drug abuse in her father; Emphysema in her father; Hypertension in her brother, brother, father, mother, sister, and sister; Seizures in her brother; Transient ischemic attack in her mother. There is no history of Breast cancer.      Current Outpatient Medications:  .  Aspirin 81 MG EC tablet, 1 tablet daily., Disp: , Rfl:  .  atorvastatin (LIPITOR) 10 MG tablet, TAKE 1 TABLET BY MOUTH EVERY DAY, Disp: 90 tablet, Rfl: 3 .  azelastine (OPTIVAR) 0.05 % ophthalmic solution, INSTILL 1 DROP INTO BOTH EYES TWICE A DAY, Disp: , Rfl: 3 .  Biotin 5000 MCG CAPS, 1 capsule daily., Disp: , Rfl:  .  Biotin 5000 MCG CAPS, Take by mouth., Disp: , Rfl:  .  Calcium Carbonate-Vit D-Min (CALCIUM 1200 PO), 1 tablet daily., Disp: , Rfl:  .  carvedilol (COREG) 12.5 MG tablet, Take 1 tablet (12.5 mg total) 2 (two) times daily by mouth., Disp: 180 tablet, Rfl: 1 .  COMBIGAN 0.2-0.5 % ophthalmic solution, 1 DROP IN BOTH EYES TWICE A DAY, Disp: , Rfl: 6 .  folic acid (FOLVITE) 800 MCG tablet, 1 tablet daily., Disp: , Rfl:  .  latanoprost (XALATAN) 0.005 % ophthalmic solution, , Disp: , Rfl:  .  methotrexate 2.5 MG tablet, 7 tablets once a week. , Disp: , Rfl:  .  Omega-3 Fatty Acids (FISH OIL BURP-LESS) 1000 MG CAPS, Take 1 capsule by mouth daily. , Disp: , Rfl:  .  triamterene-hydrochlorothiazide (MAXZIDE-25) 37.5-25 MG tablet, Take 1 tablet daily by mouth., Disp: 90  tablet, Rfl: 1 .  valACYclovir (VALTREX) 1000 MG tablet, TAKE 1 TABLET BY MOUTH EVERY DAY, Disp: 90 tablet, Rfl: 0 .  atorvastatin (LIPITOR) 10 MG tablet, TAKE 1 TABLET BY MOUTH EVERY DAY, Disp: 90 tablet, Rfl: 0  Patient Care Team: Maryella Shivers as PCP - General (Physician Assistant)     Objective:   Vitals: BP 137/79 (BP Location: Left Arm, Patient Position: Sitting, Cuff Size: Normal)   Pulse 78   Temp (!) 97.4 F (36.3 C) (Oral)   Wt 153 lb (69.4 kg)   SpO2 99%   BMI 26.26 kg/m   Physical Exam  Constitutional: She is oriented to person, place, and time. She appears well-developed and well-nourished.  HENT:  Right Ear: External ear normal.  Left Ear: External ear normal.  Mouth/Throat: Oropharynx is clear and moist. No oropharyngeal exudate.  Neck: Neck supple.  Cardiovascular: Normal rate and regular rhythm.  Pulmonary/Chest: Effort normal and breath sounds normal. Right breast exhibits no inverted nipple, no mass, no nipple discharge, no skin change and no tenderness. Left breast exhibits no inverted nipple, no mass, no nipple discharge, no skin change and no tenderness. No breast swelling, tenderness, discharge or bleeding. Breasts are symmetrical.  Abdominal: Soft. Bowel sounds are normal.  Genitourinary: No labial fusion. There is no rash, tenderness, lesion or injury on the right labia. There is no rash, tenderness, lesion or injury on the left labia.  Neurological: She is alert and oriented to person, place, and time.  Skin: Skin is warm and dry.  Psychiatric: She has a normal mood and affect. Her behavior is normal.    Activities of Daily Living In your present state of health, do you have any difficulty performing the following activities: 02/21/2018 02/21/2018  Hearing? N N  Vision? Y Y  Comment - Wear eye glasses  Difficulty concentrating or making decisions? N N  Walking or climbing stairs? N N  Dressing or bathing? N N  Doing errands, shopping? N N    Some recent data might be hidden    Fall Risk Assessment Fall Risk  02/21/2018 02/21/2018 02/02/2017 12/21/2014  Falls in the past year? 1 1 No No  Number falls in past yr: 0 0 - -  Injury with Fall? 0 1 - -     Depression Screen PHQ 2/9 Scores 02/02/2017 12/21/2014  PHQ - 2 Score 0 0  PHQ- 9 Score 2 -    6CIT Screen 02/21/2018  What Year? 0 points  What month? 0 points  What time? 0 points  Count back from 20 0 points  Months in reverse 0 points  Repeat phrase 2 points  Total Score 2        Assessment & Plan:     Annual Wellness Visit  Reviewed patient's Family Medical History Reviewed and updated list of patient's medical providers Assessment of cognitive impairment was done Assessed patient's functional ability Established a written schedule for health screening services Health Risk Assessent Completed and Reviewed  Exercise Activities and Dietary recommendations Goals    . Exercise 150 minutes per week (moderate activity)       Immunization History  Administered Date(s) Administered  . Influenza Inj Mdck Quad Pf 01/08/2017  . Influenza Split 02/01/2011  . Influenza, High Dose Seasonal PF 01/22/2018  . Influenza,inj,Quad PF,6+ Mos 12/21/2014  . Pneumococcal Conjugate-13 08/22/2017  . Tdap 10/18/2009  . Zoster 04/24/2013    Health Maintenance  Topic Date Due  . PNA vac Low Risk Adult (2 of 2 - PPSV23) 08/23/2018  . MAMMOGRAM  07/25/2019  . TETANUS/TDAP  10/19/2019  . PAP SMEAR  02/21/2021  . COLONOSCOPY  12/26/2023  . INFLUENZA VACCINE  Completed  . DEXA SCAN  Completed  . Hepatitis C Screening  Completed  . HIV Screening  Completed     Discussed health benefits of physical activity, and encouraged her to engage in regular exercise appropriate for her age and condition.    1. Welcome to Medicare preventive visit  - EKG 12-Lead  2. Essential (primary) hypertension  Well controlled, continue current medications.  - CBC with  Differential/Platelet - Comprehensive  metabolic panel - TSH  3. Hypercholesteremia  Continue Lipitor 10 mg daily.  4. Need for pneumococcal vaccination  Updated 08/2017.  5. Need for influenza vaccination   6. Encounter for osteoporosis screening in asymptomatic postmenopausal patient  - DG Bone Density; Future  7. Cervical cancer screening  - Cytology - PAP  8. Breast cancer screening  - MM Digital Screening; Future  9. Hyperlipidemia, unspecified hyperlipidemia type  - Lipid panel  The entirety of the information documented in the History of Present Illness, Review of Systems and Physical Exam were personally obtained by me. Portions of this information were initially documented by Sherril Croon, CMA and reviewed by me for thoroughness and accuracy.      ------------------------------------------------------------------------------------------------------------    Trey Sailors, PA-C  [facility]

## 2018-02-21 ENCOUNTER — Other Ambulatory Visit (HOSPITAL_COMMUNITY)
Admission: RE | Admit: 2018-02-21 | Discharge: 2018-02-21 | Disposition: A | Discharge status: Home / Self Care | Payer: Medicare Other | Source: Ambulatory Visit | Attending: Physician Assistant | Admitting: Physician Assistant

## 2018-02-21 ENCOUNTER — Encounter: Payer: Self-pay | Admitting: Physician Assistant

## 2018-02-21 ENCOUNTER — Ambulatory Visit: Payer: Medicare Other | Admitting: Physician Assistant

## 2018-02-21 VITALS — BP 137/79 | HR 78 | Temp 97.4°F | Wt 153.0 lb

## 2018-02-21 DIAGNOSIS — I10 Essential (primary) hypertension: Secondary | ICD-10-CM

## 2018-02-21 DIAGNOSIS — Z13820 Encounter for screening for osteoporosis: Secondary | ICD-10-CM

## 2018-02-21 DIAGNOSIS — Z124 Encounter for screening for malignant neoplasm of cervix: Secondary | ICD-10-CM | POA: Insufficient documentation

## 2018-02-21 DIAGNOSIS — Z23 Encounter for immunization: Secondary | ICD-10-CM

## 2018-02-21 DIAGNOSIS — E7800 Pure hypercholesterolemia, unspecified: Secondary | ICD-10-CM

## 2018-02-21 DIAGNOSIS — Z1239 Encounter for other screening for malignant neoplasm of breast: Secondary | ICD-10-CM

## 2018-02-21 DIAGNOSIS — Z780 Asymptomatic menopausal state: Secondary | ICD-10-CM

## 2018-02-21 DIAGNOSIS — E785 Hyperlipidemia, unspecified: Secondary | ICD-10-CM

## 2018-02-21 DIAGNOSIS — Z0000 Encounter for general adult medical examination without abnormal findings: Secondary | ICD-10-CM

## 2018-02-22 LAB — COMPREHENSIVE METABOLIC PANEL
ALT: 17 IU/L (ref 0–32)
AST: 23 IU/L (ref 0–40)
Albumin/Globulin Ratio: 1.7 (ref 1.2–2.2)
Albumin: 4.4 g/dL (ref 3.6–4.8)
Alkaline Phosphatase: 56 IU/L (ref 39–117)
BUN/Creatinine Ratio: 30 — ABNORMAL HIGH (ref 12–28)
BUN: 21 mg/dL (ref 8–27)
Bilirubin Total: 0.6 mg/dL (ref 0.0–1.2)
CO2: 21 mmol/L (ref 20–29)
Calcium: 9.5 mg/dL (ref 8.7–10.3)
Chloride: 99 mmol/L (ref 96–106)
Creatinine, Ser: 0.7 mg/dL (ref 0.57–1.00)
GFR calc Af Amer: 105 mL/min/{1.73_m2} (ref >59)
GFR calc non Af Amer: 91 mL/min/{1.73_m2} (ref >59)
Globulin, Total: 2.6 g/dL (ref 1.5–4.5)
Glucose: 93 mg/dL (ref 65–99)
Potassium: 3.7 mmol/L (ref 3.5–5.2)
Sodium: 139 mmol/L (ref 134–144)
Total Protein: 7 g/dL (ref 6.0–8.5)

## 2018-02-22 LAB — CBC WITH DIFFERENTIAL/PLATELET
Basophils Absolute: 0.1 10*3/uL (ref 0.0–0.2)
Basos: 1 %
EOS (ABSOLUTE): 0.1 10*3/uL (ref 0.0–0.4)
Eos: 1 %
Hematocrit: 38.2 % (ref 34.0–46.6)
Hemoglobin: 13.1 g/dL (ref 11.1–15.9)
Immature Grans (Abs): 0 10*3/uL (ref 0.0–0.1)
Immature Granulocytes: 0 %
Lymphocytes Absolute: 1.7 10*3/uL (ref 0.7–3.1)
Lymphs: 23 %
MCH: 34.8 pg — ABNORMAL HIGH (ref 26.6–33.0)
MCHC: 34.3 g/dL (ref 31.5–35.7)
MCV: 102 fL — ABNORMAL HIGH (ref 79–97)
Monocytes Absolute: 0.8 10*3/uL (ref 0.1–0.9)
Monocytes: 10 %
Neutrophils Absolute: 5 10*3/uL (ref 1.4–7.0)
Neutrophils: 65 %
Platelets: 176 10*3/uL (ref 150–450)
RBC: 3.76 x10E6/uL — ABNORMAL LOW (ref 3.77–5.28)
RDW: 12.8 % (ref 12.3–15.4)
WBC: 7.7 10*3/uL (ref 3.4–10.8)

## 2018-02-22 LAB — LIPID PANEL
Chol/HDL Ratio: 2.2 ratio (ref 0.0–4.4)
Cholesterol, Total: 189 mg/dL (ref 100–199)
HDL: 85 mg/dL (ref >39)
LDL Calculated: 82 mg/dL (ref 0–99)
Triglycerides: 112 mg/dL (ref 0–149)
VLDL Cholesterol Cal: 22 mg/dL (ref 5–40)

## 2018-02-22 LAB — CYTOLOGY - PAP
Diagnosis: NEGATIVE
HPV: DETECTED — AB

## 2018-02-22 LAB — TSH: TSH: 0.895 u[IU]/mL (ref 0.450–4.500)

## 2018-02-26 ENCOUNTER — Telehealth: Payer: Self-pay

## 2018-02-26 NOTE — Telephone Encounter (Signed)
-----   Message from Trey Sailors, [place] sent at 02/26/2018  3:59 PM EST ----- Her CMET is normal, no glucose, kidney and liver function normal. Lipids well controlled. TSH normal. Red blood cells slightly large - would like to add B12 and folate if she is agreeable. PAP smear came back normal for the cells but HPV positive. We can repeat next year.

## 2018-02-26 NOTE — Telephone Encounter (Signed)
No answer

## 2018-03-01 NOTE — Telephone Encounter (Signed)
Pt advised.  She agreed to have B-12 and Folate added.  I called [facility] and they no longer have the sample.   Thanks,   -Vernona Rieger

## 2018-03-01 NOTE — Telephone Encounter (Signed)
LMTCB

## 2018-03-03 ENCOUNTER — Other Ambulatory Visit: Payer: Self-pay | Admitting: Physician Assistant

## 2018-03-03 DIAGNOSIS — B009 Herpesviral infection, unspecified: Secondary | ICD-10-CM

## 2018-03-06 ENCOUNTER — Other Ambulatory Visit: Payer: Self-pay | Admitting: Physician Assistant

## 2018-03-06 DIAGNOSIS — I10 Essential (primary) hypertension: Secondary | ICD-10-CM

## 2018-03-12 ENCOUNTER — Other Ambulatory Visit: Payer: Self-pay | Admitting: Physician Assistant

## 2018-03-12 DIAGNOSIS — B009 Herpesviral infection, unspecified: Secondary | ICD-10-CM

## 2018-03-12 NOTE — Telephone Encounter (Signed)
[facility] faxed refill request for the following medications:  valACYclovir (VALTREX) 1000 MG tablet  Last Rx: 11/20/17 LOV: 08/22/17 Please advise. Thanks TNP

## 2018-03-15 MED ORDER — VALACYCLOVIR HCL 1 G PO TABS: 1000.0000 mg | ORAL_TABLET | Freq: Every day | ORAL | 0 refills | Status: DC

## 2018-03-18 ENCOUNTER — Other Ambulatory Visit: Payer: Self-pay | Admitting: Physician Assistant

## 2018-03-18 DIAGNOSIS — I10 Essential (primary) hypertension: Secondary | ICD-10-CM

## 2018-03-20 ENCOUNTER — Telehealth: Payer: Self-pay

## 2018-03-20 NOTE — Telephone Encounter (Signed)
Call pt regarding lung screening. Left pt message to return call.  

## 2018-03-23 ENCOUNTER — Telehealth: Payer: Self-pay

## 2018-03-23 NOTE — Telephone Encounter (Signed)
Call pt regarding lung screening. Left message for pt to return call.  

## 2018-04-08 ENCOUNTER — Encounter: Payer: Self-pay | Admitting: *Deleted

## 2018-04-08 ENCOUNTER — Telehealth: Payer: Self-pay | Admitting: *Deleted

## 2018-04-08 NOTE — Telephone Encounter (Signed)
Attempted to contact patient r/t LDCT Screening follow up due at this time. Attempted to contact patient r/t LDCT Screening follow up due at this time.  No answer received, unable to leave message at this time, will attempt contact at later date.   

## 2018-04-16 ENCOUNTER — Ambulatory Visit
Admission: RE | Admit: 2018-04-16 | Discharge: 2018-04-16 | Disposition: A | Discharge status: Home / Self Care | Payer: Medicare Other | Source: Ambulatory Visit | Attending: Physician Assistant | Admitting: Physician Assistant

## 2018-04-16 DIAGNOSIS — Z13820 Encounter for screening for osteoporosis: Secondary | ICD-10-CM

## 2018-04-16 DIAGNOSIS — Z780 Asymptomatic menopausal state: Secondary | ICD-10-CM | POA: Insufficient documentation

## 2018-04-29 ENCOUNTER — Telehealth: Payer: Self-pay

## 2018-04-29 NOTE — Telephone Encounter (Signed)
lmtcb

## 2018-04-29 NOTE — Telephone Encounter (Signed)
Patient advised as below. Patient will come in to talk about treatment options.

## 2018-04-29 NOTE — Telephone Encounter (Signed)
Pt returned call ° °teri °

## 2018-04-29 NOTE — Telephone Encounter (Signed)
-----   Message from Trey Sailors, [place] sent at 04/26/2018  4:31 PM EST ----- Patient's bone density scan shows osteoporosis which is weakening of bones that we would treat with a prescription medication. The medication is fosamax 70 mg once weekly. She would need to take this on an empty stomach with a full glass of water and remain upright for 30 minutes to safely take this drug. If she would like to do this, please send in fosamax 70 mg once weekly #12 and have her schedule follow up OV in 6 weeks to see how she is doing.

## 2018-05-01 ENCOUNTER — Ambulatory Visit: Payer: Medicare Other | Admitting: Physician Assistant

## 2018-05-01 ENCOUNTER — Encounter: Payer: Self-pay | Admitting: Physician Assistant

## 2018-05-01 VITALS — BP 152/74 | HR 69 | Temp 97.9°F | Wt 155.6 lb

## 2018-05-01 DIAGNOSIS — D7589 Other specified diseases of blood and blood-forming organs: Secondary | ICD-10-CM | POA: Diagnosis not present

## 2018-05-01 DIAGNOSIS — I7300 Raynaud's syndrome without gangrene: Secondary | ICD-10-CM

## 2018-05-01 DIAGNOSIS — M810 Age-related osteoporosis without current pathological fracture: Secondary | ICD-10-CM

## 2018-05-01 DIAGNOSIS — I10 Essential (primary) hypertension: Secondary | ICD-10-CM

## 2018-05-01 MED ORDER — AMLODIPINE BESYLATE 5 MG PO TABS: 5.0000 mg | ORAL_TABLET | Freq: Every day | ORAL | 0 refills | Status: DC

## 2018-05-01 MED ORDER — ALENDRONATE SODIUM 70 MG PO TABS: 70.0000 mg | ORAL_TABLET | ORAL | 11 refills | Status: DC

## 2018-05-01 NOTE — Progress Notes (Signed)
Patient: Courtney LulasMaggie Husain Female    DOB: 1951-01-12   68 y.o.   MRN: [id number] Visit Date: 05/01/2018  Today's Provider: Trey SailorsAdriana M Twana Wileman, PA-C   Chief Complaint  Patient presents with  . Osteoporosis   Subjective:    HPI  Osteoporosis Patient presents today for follow-up on her osteoporosis. Patient has not taking any medication at this moment. Patient states she has not been having any pain. She had a DEXA in 2010 that showed osteopenia. Most recent DEXA on 04/16/2018 shows T-score -2.5. She is concerned that she is taking too many medications and brings her medications in for review.  HTN: Currently taking Maxzide-25 mg daily in addition to carvedilol 12.5 mg BID. She has been on coreg for many years, remotely had an episode of atrial flutter but reports she has not had issues with that ever again. She is followed by Dr. Gavin [facility] at rheumatology and she has Raynaud's disease. She reports they talked about possibly switching to CCB like Norvasc and wants to know if this is possible.   Macrocytosis: Patient is followed by Dr. Gavin PottersKernodle and is currently taking methotrexate for rheumatoid arthritis. Her RBCs have been macrocytic for many years. Most recently in October her MCV was 103. There was unfortunately not enough sample to add on folate and B12 but patient has been taking daily supplementation.   CBC Latest Ref Rng & Units 05/01/2018 02/21/2018 02/02/2017  WBC 3.4 - 10.8 x10E3/uL 7.6 7.7 8.2  Hemoglobin 11.1 - 15.9 g/dL 29.514.2 62.113.1 30.814.7  Hematocrit 34.0 - 46.6 % 41.4 38.2 42.7  Platelets 150 - 450 x10E3/uL 201 176 222     No Known Allergies   Current Outpatient Medications:  .  Aspirin 81 MG EC tablet, 1 tablet daily., Disp: , Rfl:  .  atorvastatin (LIPITOR) 10 MG tablet, TAKE 1 TABLET BY MOUTH EVERY DAY, Disp: 90 tablet, Rfl: 3 .  azelastine (OPTIVAR) 0.05 % ophthalmic solution, INSTILL 1 DROP INTO BOTH EYES TWICE A DAY, Disp: , Rfl: 3 .  Biotin 5000 MCG CAPS, 1 capsule  daily., Disp: , Rfl:  .  Biotin 5000 MCG CAPS, Take by mouth., Disp: , Rfl:  .  Calcium Carbonate-Vit D-Min (CALCIUM 1200 PO), 1 tablet daily., Disp: , Rfl:  .  carvedilol (COREG) 12.5 MG tablet, TAKE 1 TABLET (12.5 MG TOTAL) 2 (TWO) TIMES DAILY BY MOUTH., Disp: 180 tablet, Rfl: 1 .  COMBIGAN 0.2-0.5 % ophthalmic solution, 1 DROP IN BOTH EYES TWICE A DAY, Disp: , Rfl: 6 .  folic acid (FOLVITE) 800 MCG tablet, 1 tablet daily., Disp: , Rfl:  .  latanoprost (XALATAN) 0.005 % ophthalmic solution, , Disp: , Rfl:  .  methotrexate 2.5 MG tablet, 7 tablets once a week. , Disp: , Rfl:  .  Omega-3 Fatty Acids (FISH OIL BURP-LESS) 1000 MG CAPS, Take 1 capsule by mouth daily. , Disp: , Rfl:  .  triamterene-hydrochlorothiazide (MAXZIDE-25) 37.5-25 MG tablet, TAKE 1 TABLET BY MOUTH EVERY DAY, Disp: 90 tablet, Rfl: 1 .  valACYclovir (VALTREX) 1000 MG tablet, Take 1 tablet (1,000 mg total) by mouth daily., Disp: 90 tablet, Rfl: 0 .  atorvastatin (LIPITOR) 10 MG tablet, TAKE 1 TABLET BY MOUTH EVERY DAY, Disp: 90 tablet, Rfl: 0  Review of Systems  HENT: Negative.   Respiratory: Negative.   Genitourinary: Negative.   Neurological: Negative.     Social History   Tobacco Use  . Smoking status: Former Smoker    Packs/day: 1.00  Years: 32.00    Pack years: 32.00    Types: Cigarettes    Last attempt to quit: 2011    Years since quitting: 9.1  . Smokeless tobacco: Never Used  . Tobacco comment: QUIT IN 2010  Substance Use Topics  . Alcohol use: Yes    Alcohol/week: 7.0 standard drinks    Types: 7 Glasses of wine per week      Objective:   BP (!) 152/74 (BP Location: Left Arm, Patient Position: Sitting, Cuff Size: Normal)   Pulse 69   Temp 97.9 F (36.6 C) (Oral)   Wt 155 lb 9.6 oz (70.6 kg)   BMI 26.71 kg/m  Vitals:   05/01/18 1549  BP: (!) 152/74  Pulse: 69  Temp: 97.9 F (36.6 C)  TempSrc: Oral  Weight: 155 lb 9.6 oz (70.6 kg)     Physical Exam Constitutional:      Appearance:  Normal appearance.  Cardiovascular:     Rate and Rhythm: Normal rate and regular rhythm.     Heart sounds: Normal heart sounds.  Pulmonary:     Effort: Pulmonary effort is normal.     Breath sounds: Normal breath sounds.  Neurological:     Mental Status: She is alert and oriented to person, place, and time. Mental status is at baseline.  Psychiatric:        Mood and Affect: Mood normal.        Behavior: Behavior normal.         Assessment & Plan    1. Essential (primary) hypertension  We can switch her carvedilol to norvasc as below. She has upcoming follow up with Dr. Gavin Potters in 1 month and she can discuss symptoms at that time.   - amLODipine (NORVASC) 5 MG tablet; Take 1 tablet (5 mg total) by mouth daily.  Dispense: 90 tablet; Refill: 0  2. Raynaud's disease without gangrene  - amLODipine (NORVASC) 5 MG tablet; Take 1 tablet (5 mg total) by mouth daily.  Dispense: 90 tablet; Refill: 0  3. Macrocytosis  Macrocytosis has improved slightly on recheck today, with MCV decreasing from 103 to 101. We have discussed that she can consolidate supplementation as long as multivitamin has levels of the vitamins she is taking now.   - CBC with Differential  4. Osteoporosis, unspecified osteoporosis type, unspecified pathological fracture presence  Counseled on risks and benefits including bone pain, jaw necrosis, hip fracture reduction. She agreed to proceed with medication. Follow up 6 mo. Repeat DEXA in 2 years.  - alendronate (FOSAMAX) 70 MG tablet; Take 1 tablet (70 mg total) by mouth every 7 (seven) days. Take with a full glass of water on an empty stomach.  Dispense: 4 tablet; Refill: 11  Return in about 6 months (around 10/30/2018) for osteoporosis.  The entirety of the information documented in the History of Present Illness, Review of Systems and Physical Exam were personally obtained by me. Portions of this information were initially documented by [facility], CMA and  reviewed by me for thoroughness and accuracy.       Trey Sailors, PA-C  [facility]

## 2018-05-01 NOTE — Patient Instructions (Signed)
Osteoporosis    Osteoporosis happens when your bones get thin and weak. This can cause your bones to break (fracture) more easily. You can do things at home to make your bones stronger.  Follow these instructions at home:    Activity   Exercise as told by your doctor. Ask your doctor what activities are safe for you. You should do:  ? Exercises that make your muscles work to hold your body weight up (weight-bearing exercises). These include tai chi, yoga, and walking.  ? Exercises to make your muscles stronger. One example is lifting weights.  Lifestyle   Limit alcohol intake to no more than 1 drink a day for nonpregnant women and 2 drinks a day for men. One drink equals 12 oz of beer, 5 oz of wine, or 1 oz of hard liquor.   Do not use any products that have nicotine or tobacco in them. These include cigarettes and e-cigarettes. If you need help quitting, ask your doctor.  Preventing falls   Use tools to help you move around (mobility aids) as needed. These include canes, walkers, scooters, and crutches.   Keep rooms well-lit and free of clutter.   Put away things that could make you trip. These include cords and rugs.   Install safety rails on stairs. Install grab bars in bathrooms.   Use rubber mats in slippery areas, like bathrooms.   Wear shoes that:  ? Fit you well.  ? Support your feet.  ? Have closed toes.  ? Have rubber soles or low heels.   Tell your doctor about all of the medicines you are taking. Some medicines can make you more likely to fall.  General instructions   Eat plenty of calcium and vitamin D. These nutrients are good for your bones. Good sources of calcium and vitamin D include:  ? Some fatty fish, such as salmon and tuna.  ? Foods that have calcium and vitamin D added to them (fortified foods). For example, some breakfast cereals are fortified with calcium and vitamin D.  ? Egg yolks.  ? Cheese.  ? Liver.   Take over-the-counter and prescription medicines only as told by your  doctor.   Keep all follow-up visits as told by your doctor. This is important.  Contact a doctor if:   You have not been tested (screened) for osteoporosis and you are:  ? A woman who is age 65 or older.  ? A man who is age 70 or older.  Get help right away if:   You fall.   You get hurt.  Summary   Osteoporosis happens when your bones get thin and weak.   Weak bones can break (fracture) more easily.   Eat plenty of calcium and vitamin D. These nutrients are good for your bones.   Tell your doctor about all of the medicines that you take.  This information is not intended to replace advice given to you by your health care provider. Make sure you discuss any questions you have with your health care provider.  Document Released: 05/29/2011 Document Revised: 12/29/2016 Document Reviewed: 12/29/2016  Elsevier Interactive Patient Education  2019 [employer]

## 2018-05-02 ENCOUNTER — Telehealth: Payer: Self-pay

## 2018-05-02 LAB — CBC WITH DIFFERENTIAL/PLATELET
Basophils Absolute: 0.1 10*3/uL (ref 0.0–0.2)
Basos: 1 %
EOS (ABSOLUTE): 0.1 10*3/uL (ref 0.0–0.4)
Eos: 2 %
Hematocrit: 41.4 % (ref 34.0–46.6)
Hemoglobin: 14.2 g/dL (ref 11.1–15.9)
Immature Grans (Abs): 0 10*3/uL (ref 0.0–0.1)
Immature Granulocytes: 0 %
Lymphocytes Absolute: 1.6 10*3/uL (ref 0.7–3.1)
Lymphs: 22 %
MCH: 34.5 pg — ABNORMAL HIGH (ref 26.6–33.0)
MCHC: 34.3 g/dL (ref 31.5–35.7)
MCV: 101 fL — ABNORMAL HIGH (ref 79–97)
Monocytes Absolute: 0.8 10*3/uL (ref 0.1–0.9)
Monocytes: 11 %
Neutrophils Absolute: 4.9 10*3/uL (ref 1.4–7.0)
Neutrophils: 64 %
Platelets: 201 10*3/uL (ref 150–450)
RBC: 4.12 x10E6/uL (ref 3.77–5.28)
RDW: 12.3 % (ref 11.7–15.4)
WBC: 7.6 10*3/uL (ref 3.4–10.8)

## 2018-05-02 NOTE — Telephone Encounter (Signed)
Patient was advised.  

## 2018-05-02 NOTE — Telephone Encounter (Signed)
-----   Message from Trey Sailors, [place] sent at 05/02/2018  8:49 AM EST ----- Her blood count improved slightly after taking the b12 and folate. I would make sure she chooses a multivitamin with the same amount of folate as she was taking as a separate pill. Usually prenatal vitamins have enough.

## 2018-06-09 ENCOUNTER — Other Ambulatory Visit: Payer: Self-pay | Admitting: Physician Assistant

## 2018-06-09 DIAGNOSIS — B009 Herpesviral infection, unspecified: Secondary | ICD-10-CM

## 2018-07-27 ENCOUNTER — Other Ambulatory Visit: Payer: Self-pay | Admitting: Physician Assistant

## 2018-07-27 DIAGNOSIS — I10 Essential (primary) hypertension: Secondary | ICD-10-CM

## 2018-07-27 DIAGNOSIS — I7300 Raynaud's syndrome without gangrene: Secondary | ICD-10-CM

## 2018-09-12 ENCOUNTER — Other Ambulatory Visit: Payer: Self-pay | Admitting: Physician Assistant

## 2018-09-12 DIAGNOSIS — B009 Herpesviral infection, unspecified: Secondary | ICD-10-CM

## 2018-09-12 DIAGNOSIS — I10 Essential (primary) hypertension: Secondary | ICD-10-CM

## 2018-09-17 ENCOUNTER — Other Ambulatory Visit: Payer: Self-pay

## 2018-09-17 ENCOUNTER — Ambulatory Visit
Admission: RE | Admit: 2018-09-17 | Discharge: 2018-09-17 | Disposition: A | Discharge status: Home / Self Care | Payer: Medicare Other | Source: Ambulatory Visit | Attending: Physician Assistant | Admitting: Physician Assistant

## 2018-09-17 DIAGNOSIS — Z1231 Encounter for screening mammogram for malignant neoplasm of breast: Secondary | ICD-10-CM | POA: Diagnosis present

## 2018-09-17 DIAGNOSIS — Z1239 Encounter for other screening for malignant neoplasm of breast: Secondary | ICD-10-CM

## 2018-10-21 ENCOUNTER — Other Ambulatory Visit: Payer: Self-pay | Admitting: Physician Assistant

## 2018-10-21 DIAGNOSIS — I7300 Raynaud's syndrome without gangrene: Secondary | ICD-10-CM

## 2018-10-21 DIAGNOSIS — I10 Essential (primary) hypertension: Secondary | ICD-10-CM

## 2018-10-30 ENCOUNTER — Other Ambulatory Visit: Payer: Self-pay

## 2018-10-30 ENCOUNTER — Encounter: Payer: Self-pay | Admitting: Physician Assistant

## 2018-10-30 ENCOUNTER — Ambulatory Visit (INDEPENDENT_AMBULATORY_CARE_PROVIDER_SITE_OTHER): Payer: Medicare Other | Admitting: Physician Assistant

## 2018-10-30 VITALS — BP 106/56 | HR 68 | Temp 98.6°F | Resp 16 | Wt 153.0 lb

## 2018-10-30 DIAGNOSIS — M8580 Other specified disorders of bone density and structure, unspecified site: Secondary | ICD-10-CM | POA: Diagnosis not present

## 2018-10-30 DIAGNOSIS — I4892 Unspecified atrial flutter: Secondary | ICD-10-CM

## 2018-10-30 DIAGNOSIS — J309 Allergic rhinitis, unspecified: Secondary | ICD-10-CM | POA: Diagnosis not present

## 2018-10-30 DIAGNOSIS — I10 Essential (primary) hypertension: Secondary | ICD-10-CM | POA: Diagnosis not present

## 2018-10-30 DIAGNOSIS — IMO0002 Reserved for concepts with insufficient information to code with codable children: Secondary | ICD-10-CM

## 2018-10-30 DIAGNOSIS — E7800 Pure hypercholesterolemia, unspecified: Secondary | ICD-10-CM | POA: Diagnosis not present

## 2018-10-30 DIAGNOSIS — I4891 Unspecified atrial fibrillation: Secondary | ICD-10-CM

## 2018-10-30 DIAGNOSIS — M059 Rheumatoid arthritis with rheumatoid factor, unspecified: Secondary | ICD-10-CM

## 2018-10-30 NOTE — Progress Notes (Signed)
Patient: Courtney Rodgers Female    DOB: 07-18-1950   68 y.o.   MRN: [id number] Visit Date: 10/30/2018  Today's Provider: Trinna Post, PA-C   Chief Complaint  Patient presents with  . Hypertension  . Osteoporosis  . Sore Throat   Subjective:     Sore Throat  This is a new problem. The current episode started 1 to 4 weeks ago. The problem has been unchanged. Neither side of throat is experiencing more pain than the other. There has been no fever. The patient is experiencing no pain. Associated symptoms include stridor, swollen glands and trouble swallowing. Pertinent negatives include no abdominal pain, congestion, coughing, diarrhea, drooling, ear discharge, ear pain, headaches, hoarse voice, plugged ear sensation, neck pain, shortness of breath or vomiting. She has had no exposure to strep or mono. She has tried nothing for the symptoms.    Hypertension, follow-up:  BP Readings from Last 3 Encounters:  10/30/18 (!) 106/56  05/01/18 (!) 152/74  02/21/18 137/79    She was last seen for hypertension 6 months ago.  BP at that visit was 152/74. 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 She has not had any more episodes but is unsure if it is due to the medication as it has not been cold.  She reports excellent compliance with treatment. She is not having side effects.  She is exercising. She is adherent to low salt diet.   Outside blood pressures are not being  checked. She is experiencing none.  Patient denies chest pain, chest pressure/discomfort, claudication, dyspnea, exertional chest pressure/discomfort, fatigue, irregular heart beat, lower extremity edema, near-syncope, orthopnea, palpitations, paroxysmal nocturnal dyspnea, syncope and tachypnea.   Cardiovascular risk factors include advanced age (older than 20 for men, 52 for women) and hypertension.  Use of agents associated with hypertension: NSAIDS.      Weight trend: stable Wt Readings from Last 3 Encounters:  10/30/18 153 lb (69.4 kg)  05/01/18 155 lb 9.6 oz (70.6 kg)  02/21/18 153 lb (69.4 kg)   BMI Readings from Last 8 Encounters:  10/30/18 26.26 kg/m  05/01/18 26.71 kg/m  02/21/18 26.26 kg/m  09/10/17 25.92 kg/m  08/22/17 25.61 kg/m  04/02/17 25.75 kg/m  02/01/17 26.43 kg/m  01/25/17 26.95 kg/m    Current diet: well balanced  ------------------------------------------------------------------------  Follow up for Osteoporosis  The patient was last seen for this 6 months ago. Changes made at last visit include starting patient on Fosamax 70mg  without issue.   She reports fair compliance with treatment. She feels that condition is Unchanged. She is not having side effects.   ------------------------------------------------------------------------------------  No Known Allergies   Current Outpatient Medications:  .  alendronate (FOSAMAX) 70 MG tablet, Take 1 tablet (70 mg total) by mouth every 7 (seven) days. Take with a full glass of water on an empty stomach., Disp: 4 tablet, Rfl: 11 .  amLODipine (NORVASC) 5 MG tablet, TAKE 1 TABLET BY MOUTH EVERY DAY, Disp: 90 tablet, Rfl: 0 .  Aspirin 81 MG EC tablet, 1 tablet daily., Disp: , Rfl:  .  atorvastatin (LIPITOR) 10 MG tablet, TAKE 1 TABLET BY MOUTH EVERY DAY, Disp: 90 tablet, Rfl: 3 .  azelastine (OPTIVAR) 0.05 % ophthalmic solution, INSTILL 1 DROP INTO BOTH EYES TWICE A DAY, Disp: , Rfl: 3 .  Biotin 5000 MCG CAPS, 1 capsule daily., Disp: , Rfl:  .  Biotin 5000 MCG CAPS, Take by mouth., Disp: ,  Rfl:  .  Calcium Carbonate-Vit D-Min (CALCIUM 1200 PO), 1 tablet daily., Disp: , Rfl:  .  COMBIGAN 0.2-0.5 % ophthalmic solution, 1 DROP IN BOTH EYES TWICE A DAY, Disp: , Rfl: 6 .  folic acid (FOLVITE) 800 MCG tablet, 1 tablet daily., Disp: , Rfl:  .  latanoprost (XALATAN) 0.005 % ophthalmic solution, , Disp: , Rfl:  .  methotrexate 2.5 MG tablet, 7 tablets once a  week. , Disp: , Rfl:  .  Omega-3 Fatty Acids (FISH OIL BURP-LESS) 1000 MG CAPS, Take 1 capsule by mouth daily. , Disp: , Rfl:  .  triamterene-hydrochlorothiazide (MAXZIDE-25) 37.5-25 MG tablet, TAKE 1 TABLET BY MOUTH EVERY DAY, Disp: 90 tablet, Rfl: 1 .  valACYclovir (VALTREX) 1000 MG tablet, TAKE 1 TABLET BY MOUTH EVERY DAY, Disp: 90 tablet, Rfl: 0  Review of Systems  HENT: Positive for postnasal drip and trouble swallowing. Negative for congestion, drooling, ear discharge, ear pain and hoarse voice.   Respiratory: Positive for stridor. Negative for cough and shortness of breath.   Gastrointestinal: Negative for abdominal pain, diarrhea and vomiting.  Musculoskeletal: Negative for neck pain.  Neurological: Negative for headaches.    Social History   Tobacco Use  . Smoking status: Former Smoker    Packs/day: 1.00    Years: 32.00    Pack years: 32.00    Types: Cigarettes    Quit date: 2011    Years since quitting: 9.6  . Smokeless tobacco: Never Used  . Tobacco comment: QUIT IN 2010  Substance Use Topics  . Alcohol use: Yes    Alcohol/week: 7.0 standard drinks    Types: 7 Glasses of wine per week      Objective:   BP (!) 106/56   Pulse 68   Temp 98.6 F (37 C) (Oral)   Resp 16   Wt 153 lb (69.4 kg)   SpO2 99%   BMI 26.26 kg/m  Vitals:   10/30/18 1115  BP: (!) 106/56  Pulse: 68  Resp: 16  Temp: 98.6 F (37 C)  TempSrc: Oral  SpO2: 99%  Weight: 153 lb (69.4 kg)     Physical Exam Constitutional:      Appearance: She is well-developed.  Cardiovascular:     Rate and Rhythm: Normal rate and regular rhythm.     Heart sounds: Normal heart sounds.  Pulmonary:     Effort: Pulmonary effort is normal.     Breath sounds: Normal breath sounds.  Skin:    General: Skin is warm and dry.  Neurological:     Mental Status: She is alert and oriented to person, place, and time. Mental status is at baseline.  Psychiatric:        Mood and Affect: Mood normal.         Behavior: Behavior normal.      No results found for any visits on 10/30/18.     Assessment & Plan    1. Essential (primary) hypertension  Well controlled, changed from carvedilol to amlodipine without issue. Continue current medications.  2. Osteopenia, unspecified location  Continue fosamax.   3. Hypercholesteremia  Continue statin.  4. Allergic rhinitis, unspecified seasonality, unspecified trigger  Start daily 2nd gen antihistamine of choice.   The entirety of the information documented in the History of Present Illness, Review of Systems and Physical Exam were personally obtained by me. Portions of this information were initially documented by Sheliah HatchKathleen Wolford, CMA and reviewed by me for thoroughness and accuracy.   F/u  4 months for AWV and CPE       Trey Sailors, PA-C  [facility] [place] as a scribe for Trey Sailors, PA-C.,have documented all relevant documentation on the behalf of Trey Sailors, PA-C,as directed by  Trey Sailors, PA-C while in the presence of Trey Sailors, PA-C.

## 2018-10-30 NOTE — Patient Instructions (Signed)
Allegra, zyrtec, claritin or xyzal - will need take one every day  Hypertension, Adult Hypertension is another name for high blood pressure. High blood pressure forces your heart to work harder to pump blood. This can cause problems over time. There are two numbers in a blood pressure reading. There is a top number (systolic) over a bottom number (diastolic). It is best to have a blood pressure that is below 120/80. Healthy choices can help lower your blood pressure, or you may need medicine to help lower it. What are the causes? The cause of this condition is not known. Some conditions may be related to high blood pressure. What increases the risk?  Smoking.  Having type 2 diabetes mellitus, high cholesterol, or both.  Not getting enough exercise or physical activity.  Being overweight.  Having too much fat, sugar, calories, or salt (sodium) in your diet.  Drinking too much alcohol.  Having long-term (chronic) kidney disease.  Having a family history of high blood pressure.  Age. Risk increases with age.  Race. You may be at higher risk if you are African American.  Gender. Men are at higher risk than women before age 5. After age 25, women are at higher risk than men.  Having obstructive sleep apnea.  Stress. What are the signs or symptoms?  High blood pressure may not cause symptoms. Very high blood pressure (hypertensive crisis) may cause: ? Headache. ? Feelings of worry or nervousness (anxiety). ? Shortness of breath. ? Nosebleed. ? A feeling of being sick to your stomach (nausea). ? Throwing up (vomiting). ? Changes in how you see. ? Very bad chest pain. ? Seizures. How is this treated?  This condition is treated by making healthy lifestyle changes, such as: ? Eating healthy foods. ? Exercising more. ? Drinking less alcohol.  Your health care provider may prescribe medicine if lifestyle changes are not enough to get your blood pressure under control, and if:  ? Your top number is above 130. ? Your bottom number is above 80.  Your personal target blood pressure may vary. Follow these instructions at home: Eating and drinking   If told, follow the DASH eating plan. To follow this plan: ? Fill one half of your plate at each meal with fruits and vegetables. ? Fill one fourth of your plate at each meal with whole grains. Whole grains include whole-wheat pasta, brown rice, and whole-grain bread. ? Eat or drink low-fat dairy products, such as skim milk or low-fat yogurt. ? Fill one fourth of your plate at each meal with low-fat (lean) proteins. Low-fat proteins include fish, chicken without skin, eggs, beans, and tofu. ? Avoid fatty meat, cured and processed meat, or chicken with skin. ? Avoid pre-made or processed food.  Eat less than 1,500 mg of salt each day.  Do not drink alcohol if: ? Your doctor tells you not to drink. ? You are pregnant, may be pregnant, or are planning to become pregnant.  If you drink alcohol: ? Limit how much you use to:  0-1 drink a day for women.  0-2 drinks a day for men. ? Be aware of how much alcohol is in your drink. In the [place] one drink equals one 12 oz bottle of beer (355 mL), one 5 oz glass of wine (148 mL), or one 1 oz glass of hard liquor (44 mL). Lifestyle   Work with your doctor to stay at a healthy weight or to lose weight. Ask your doctor what the best  weight is for you.  Get at least 30 minutes of exercise most days of the week. This may include walking, swimming, or biking.  Get at least 30 minutes of exercise that strengthens your muscles (resistance exercise) at least 3 days a week. This may include lifting weights or doing Pilates.  Do not use any products that contain nicotine or tobacco, such as cigarettes, e-cigarettes, and chewing tobacco. If you need help quitting, ask your doctor.  Check your blood pressure at home as told by your doctor.  Keep all follow-up visits as told by your  doctor. This is important. Medicines  Take over-the-counter and prescription medicines only as told by your doctor. Follow directions carefully.  Do not skip doses of blood pressure medicine. The medicine does not work as well if you skip doses. Skipping doses also puts you at risk for problems.  Ask your doctor about side effects or reactions to medicines that you should watch for. Contact a doctor if you:  Think you are having a reaction to the medicine you are taking.  Have headaches that keep coming back (recurring).  Feel dizzy.  Have swelling in your ankles.  Have trouble with your vision. Get help right away if you:  Get a very bad headache.  Start to feel mixed up (confused).  Feel weak or numb.  Feel faint.  Have very bad pain in your: ? Chest. ? Belly (abdomen).  Throw up more than once.  Have trouble breathing. Summary  Hypertension is another name for high blood pressure.  High blood pressure forces your heart to work harder to pump blood.  For most people, a normal blood pressure is less than 120/80.  Making healthy choices can help lower blood pressure. If your blood pressure does not get lower with healthy choices, you may need to take medicine. This information is not intended to replace advice given to you by your health care provider. Make sure you discuss any questions you have with your health care provider. Document Released: 08/23/2007 Document Revised: 11/14/2017 Document Reviewed: 11/14/2017 Elsevier Patient Education  2020 [employer].

## 2018-11-02 ENCOUNTER — Other Ambulatory Visit: Payer: Self-pay | Admitting: Family Medicine

## 2018-11-02 DIAGNOSIS — E7800 Pure hypercholesterolemia, unspecified: Secondary | ICD-10-CM

## 2018-12-06 ENCOUNTER — Other Ambulatory Visit: Payer: Self-pay | Admitting: Physician Assistant

## 2018-12-06 DIAGNOSIS — I10 Essential (primary) hypertension: Secondary | ICD-10-CM

## 2018-12-06 DIAGNOSIS — I7300 Raynaud's syndrome without gangrene: Secondary | ICD-10-CM

## 2018-12-14 ENCOUNTER — Other Ambulatory Visit: Payer: Self-pay

## 2018-12-14 DIAGNOSIS — Z20822 Contact with and (suspected) exposure to covid-19: Secondary | ICD-10-CM

## 2018-12-15 LAB — NOVEL CORONAVIRUS, NAA: SARS-CoV-2, NAA: NOT DETECTED

## 2019-02-24 NOTE — Progress Notes (Signed)
Patient: Courtney Rodgers, Female    DOB: [birth year], 68 y.o.   MRN: [id number] Visit Date: 02/25/2019  Today's Provider: Trey Sailors, PA-C   Chief Complaint  Patient presents with  . Annual Exam   Subjective:     Complete Physical Courtney Rodgers is a 68 y.o. female. She feels well. She reports exercising includes walking for 53-5 miles daily. She reports she is sleeping poorly.  ----------------------------------------------------------- Last pap:02/21/2018, normal cytology positive for HPV Last mammogram:09/17/2018, normal Last colonoscopy:12/25/2013, several polyps and is due this year Last bone density: 04/16/2018  Hypertension, follow-up:  BP Readings from Last 3 Encounters:  02/25/19 124/78  10/30/18 (!) 106/56  05/01/18 (!) 152/74    She was last seen for hypertension 4 months ago.  BP at that visit was 106/56. Management changes since that visit include continue amlodipine. She reports good compliance with treatment. She is not having side effects.  She is exercising. She is adherent to low salt diet.   Outside blood pressures are not checked. She is experiencing none.  Cardiovascular risk factors include advanced age (older than 44 for men, 14 for women) and hypertension.  Use of agents associated with hypertension: none.     Weight trend: stable Wt Readings from Last 3 Encounters:  02/25/19 156 lb (70.8 kg)  10/30/18 153 lb (69.4 kg)  05/01/18 155 lb 9.6 oz (70.6 kg)    Current diet: well balanced  ------------------------------------------------------------------------   Lipid/Cholesterol, Follow-up:   Last seen for this4 months ago.  Management changes since that visit include none. . Last Lipid Panel:    Component Value Date/Time   CHOL 189 02/21/2018 1121   TRIG 112 02/21/2018 1121   HDL 85 02/21/2018 1121   CHOLHDL 2.2 02/21/2018 1121   CHOLHDL 2.2 02/02/2017 1009   LDLCALC 82 02/21/2018 1121   LDLCALC 113 (H) 02/02/2017  1009    Risk factors for vascular disease include hypercholesterolemia and hypertension  She reports good compliance with treatment. She is not having side effects.  Current symptoms include none and have been stable. Weight trend: stable Prior visit with dietician: no Current diet: well balanced Current exercise: housecleaning and walking  Wt Readings from Last 3 Encounters:  02/25/19 156 lb (70.8 kg)  10/30/18 153 lb (69.4 kg)  05/01/18 155 lb 9.6 oz (70.6 kg)    -------------------------------------------------------------------   Review of Systems  Constitutional: Negative.   HENT: Negative.   Eyes: Negative.   Respiratory: Negative.   Cardiovascular: Negative.   Gastrointestinal: Negative.   Endocrine: Negative.   Genitourinary: Negative.   Musculoskeletal: Negative.   Skin: Negative.   Allergic/Immunologic: Negative.   Neurological: Negative.   Hematological: Negative.   Psychiatric/Behavioral: Negative.     Social History   Socioeconomic History  . Marital status: Divorced    Spouse name: Not on file  . Number of children: Not on file  . Years of education: Not on file  . Highest education level: Not on file  Occupational History  . Not on file  Social Needs  . Financial resource strain: Not on file  . Food insecurity    Worry: Not on file    Inability: Not on file  . Transportation needs    Medical: Not on file    Non-medical: Not on file  Tobacco Use  . Smoking status: Former Smoker    Packs/day: 1.00    Years: 32.00    Pack years: 32.00    Types: Cigarettes  Quit date: 2011    Years since quitting: 9.9  . Smokeless tobacco: Never Used  . Tobacco comment: QUIT IN 2010  Substance and Sexual Activity  . Alcohol use: Yes    Alcohol/week: 7.0 standard drinks    Types: 7 Glasses of wine per week  . Drug use: No  . Sexual activity: Not on file  Lifestyle  . Physical activity    Days per week: Not on file    Minutes per session: Not on  file  . Stress: Not on file  Relationships  . Social Musician on phone: Not on file    Gets together: Not on file    Attends religious service: Not on file    Active member of club or organization: Not on file    Attends meetings of clubs or organizations: Not on file    Relationship status: Not on file  . Intimate partner violence    Fear of current or ex partner: Not on file    Emotionally abused: Not on file    Physically abused: Not on file    Forced sexual activity: Not on file  Other Topics Concern  . Not on file  Social History Narrative  . Not on file    Past Medical History:  Diagnosis Date  . Allergy   . Hyperlipidemia   . Hypertension      Patient Active Problem List   Diagnosis Date Noted  . Glaucoma 02/01/2017  . Personal history of tobacco use, presenting hazards to health 03/03/2016  . 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 Essential (primary) hypertension 09/15/2014  . Hypercholesteremia 09/15/2014  . Hyperplastic polyp of intestine 09/15/2014  . LBP (low back pain) 09/15/2014  . Osteopenia 09/15/2014  . Symptom associated with female genital organs 09/15/2014  . Flutter-fibrillation (HCC) 09/15/2014  . Epistaxis 09/15/2014  . 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 Nonspecific abnormal finding in stool contents 09/15/2014  . 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 Carpal tunnel syndrome 09/15/2014  . Rheumatoid arthritis (HCC) 01/28/2014  . Arthritis or polyarthritis, rheumatoid (HCC) 05/12/2009  . Bone/cartilage disorder 05/12/2009    Past Surgical History:  Procedure Laterality Date  . APPENDECTOMY  1980  . CATARACT EXTRACTION Right   . TONSILLECTOMY  1960    Her family history includes Alcohol abuse in her father; CVA in her father; Congestive Heart Failure in her mother; Dementia in her mother; Diabetes in her mother; Drug abuse in her father; Emphysema in her father; Hypertension in her brother,  brother, father, mother, sister, and sister; Seizures in her brother; Transient ischemic attack in her mother. There is no history of Breast cancer.   Current Outpatient Medications:  .  alendronate (FOSAMAX) 70 MG tablet, Take 1 tablet (70 mg total) by mouth every 7 (seven) days. Take with a full glass of water on an empty stomach., Disp: 4 tablet, Rfl: 11 .  amLODipine (NORVASC) 5 MG tablet, TAKE 1 TABLET BY MOUTH EVERY DAY, Disp: 90 tablet, Rfl: 0 .  Aspirin 81 MG EC tablet, 1 tablet daily., Disp: , Rfl:  .  atorvastatin (LIPITOR) 10 MG tablet, TAKE 1 TABLET BY MOUTH EVERY DAY, Disp: 90 tablet, Rfl: 3 .  azelastine (OPTIVAR) 0.05 % ophthalmic solution, INSTILL 1 DROP INTO BOTH EYES TWICE A DAY, Disp: , Rfl: 3 .  Biotin 5000 MCG CAPS, 1 capsule daily., Disp: , Rfl:  .  Biotin 5000 MCG CAPS, Take by mouth., Disp: ,  Rfl:  .  Calcium Carbonate-Vit D-Min (CALCIUM 1200 PO), 1 tablet daily., Disp: , Rfl:  .  COMBIGAN 0.2-0.5 % ophthalmic solution, 1 DROP IN BOTH EYES TWICE A DAY, Disp: , Rfl: 6 .  folic acid (FOLVITE) 800 MCG tablet, 1 tablet daily., Disp: , Rfl:  .  latanoprost (XALATAN) 0.005 % ophthalmic solution, , Disp: , Rfl:  .  methotrexate 2.5 MG tablet, 7 tablets once a week. , Disp: , Rfl:  .  Omega-3 Fatty Acids (FISH OIL BURP-LESS) 1000 MG CAPS, Take 1 capsule by mouth daily. , Disp: , Rfl:  .  triamterene-hydrochlorothiazide (MAXZIDE-25) 37.5-25 MG tablet, TAKE 1 TABLET BY MOUTH EVERY DAY, Disp: 90 tablet, Rfl: 1 .  valACYclovir (VALTREX) 1000 MG tablet, TAKE 1 TABLET BY MOUTH EVERY DAY, Disp: 90 tablet, Rfl: 0  Patient Care Team: Maryella ShiversPollak, Adriana M, PA-C as PCP - General (Physician Assistant)     Objective:    Vitals: BP 124/78 (BP Location: Left Arm, Patient Position: Sitting, Cuff Size: Normal)   Pulse 71   Temp (!) 96.6 F (35.9 C) (Temporal)   Wt 156 lb (70.8 kg)   BMI 26.78 kg/m   Physical Exam Exam conducted with a chaperone present.  Constitutional:       Appearance: Normal appearance.  Cardiovascular:     Rate and Rhythm: Normal rate and regular rhythm.     Heart sounds: Normal heart sounds.  Pulmonary:     Effort: Pulmonary effort is normal.     Breath sounds: Normal breath sounds.  Abdominal:     General: Bowel sounds are normal.     Palpations: Abdomen is soft.  Genitourinary:    Exam position: Lithotomy position.     Vagina: Normal.     Cervix: Normal.  Skin:    General: Skin is warm and dry.  Neurological:     Mental Status: She is alert and oriented to person, place, and time. Mental status is at baseline.  Psychiatric:        Mood and Affect: Mood normal.        Behavior: Behavior normal.     Activities of Daily Living In your present state of health, do you have any difficulty performing the following activities: 02/25/2019  Hearing? N  Vision? Y  Difficulty concentrating or making decisions? Y  Walking or climbing stairs? N  Dressing or bathing? N  Doing errands, shopping? N  Some recent data might be hidden    Fall Risk Assessment Fall Risk  02/25/2019 02/21/2018 02/21/2018 02/02/2017 12/21/2014  Falls in the past year? 0 1 1 No No  Number falls in past yr: 0 0 0 - -  Injury with Fall? 0 0 1 - -     Depression Screen PHQ 2/9 Scores 02/25/2019 02/02/2017 12/21/2014  PHQ - 2 Score 1 0 0  PHQ- 9 Score 5 2 -    6CIT Screen 02/21/2018  What Year? 0 points  What month? 0 points  What time? 0 points  Count back from 20 0 points  Months in reverse 0 points  Repeat phrase 2 points  Total Score 2       Assessment & Plan:    Annual Physical Reviewed patient's Family Medical History Reviewed and updated list of patient's medical providers Assessment of cognitive impairment was done Assessed patient's functional ability Established a written schedule for health screening services Health Risk Assessent Completed and Reviewed  Exercise Activities and Dietary recommendations Goals    . Exercise  150 minutes  per week (moderate activity)       Immunization History  Administered Date(s) Administered  . Influenza Inj Mdck Quad Pf 01/08/2017  . Influenza Split 02/01/2011  . Influenza, High Dose Seasonal PF 01/22/2018  . Influenza,inj,Quad PF,6+ Mos 12/21/2014  . Influenza-Unspecified 12/20/2018  . Pneumococcal Conjugate-13 08/22/2017  . Tdap 10/18/2009  . Zoster 04/24/2013    Health Maintenance  Topic Date Due  . PNA vac Low Risk Adult (2 of 2 - PPSV23) 08/23/2018  . COLONOSCOPY  12/26/2018  . TETANUS/TDAP  10/19/2019  . MAMMOGRAM  09/16/2020  . INFLUENZA VACCINE  Completed  . DEXA SCAN  Completed  . Hepatitis C Screening  Completed     Discussed health benefits of physical activity, and encouraged her to engage in regular exercise appropriate for her age and condition.    1. Annual physical exam  - TSH - Lipid panel - Comprehensive metabolic panel - CBC with Differential/Platelet  2. Colon cancer screening  Due for repeat colonoscopy, referred today.   - Ambulatory referral to Gastroenterology  3. Need for vaccination against Streptococcus pneumoniae  Updated today, 2nd of two pneumonia vaccines. Also counseled on calling insurance for shingles vaccine.   4. Pap smear abnormality of cervix/human papillomavirus (HPV) positive  Resent per guidelines. If worse, may need colonoscopy.   - Cytology - PAP  5. Need for 23-polyvalent pneumococcal polysaccharide vaccine  - Pneumococcal polysaccharide vaccine 23-valent greater than or equal to 2yo subcutaneous/IM  The entirety of the information documented in the History of Present Illness, Review of Systems and Physical Exam were personally obtained by me. Portions of this information were initially documented by [facility], CMA and reviewed by me for thoroughness and accuracy.   F/u 1 year CPE and HTN  ------------------------------------------------------------------------------------------------------------     Trinna Post, PA-C  [facility]

## 2019-02-25 ENCOUNTER — Other Ambulatory Visit (HOSPITAL_COMMUNITY)
Admission: RE | Admit: 2019-02-25 | Discharge: 2019-02-25 | Disposition: A | Discharge status: Home / Self Care | Payer: Medicare Other | Source: Ambulatory Visit | Attending: Physician Assistant | Admitting: Physician Assistant

## 2019-02-25 ENCOUNTER — Encounter: Payer: Self-pay | Admitting: Physician Assistant

## 2019-02-25 ENCOUNTER — Ambulatory Visit (INDEPENDENT_AMBULATORY_CARE_PROVIDER_SITE_OTHER): Payer: Medicare Other | Admitting: Physician Assistant

## 2019-02-25 ENCOUNTER — Other Ambulatory Visit: Payer: Self-pay

## 2019-02-25 VITALS — BP 124/78 | HR 71 | Temp 96.6°F | Wt 156.0 lb

## 2019-02-25 DIAGNOSIS — R8789 Other abnormal findings in specimens from female genital organs: Secondary | ICD-10-CM | POA: Insufficient documentation

## 2019-02-25 DIAGNOSIS — Z23 Encounter for immunization: Secondary | ICD-10-CM

## 2019-02-25 DIAGNOSIS — Z780 Asymptomatic menopausal state: Secondary | ICD-10-CM | POA: Diagnosis not present

## 2019-02-25 DIAGNOSIS — Z124 Encounter for screening for malignant neoplasm of cervix: Secondary | ICD-10-CM | POA: Insufficient documentation

## 2019-02-25 DIAGNOSIS — Z0000 Encounter for general adult medical examination without abnormal findings: Secondary | ICD-10-CM | POA: Diagnosis not present

## 2019-02-25 DIAGNOSIS — Z1151 Encounter for screening for human papillomavirus (HPV): Secondary | ICD-10-CM | POA: Diagnosis not present

## 2019-02-25 DIAGNOSIS — Z1211 Encounter for screening for malignant neoplasm of colon: Secondary | ICD-10-CM

## 2019-02-25 DIAGNOSIS — R87618 Other abnormal cytological findings on specimens from cervix uteri: Secondary | ICD-10-CM

## 2019-02-25 NOTE — Patient Instructions (Signed)
Health Maintenance, Female Adopting a healthy lifestyle and getting preventive care are important in promoting health and wellness. Ask your health care provider about:  The right schedule for you to have regular tests and exams.  Things you can do on your own to prevent diseases and keep yourself healthy. What should I know about diet, weight, and exercise? Eat a healthy diet   Eat a diet that includes plenty of vegetables, fruits, low-fat dairy products, and lean protein.  Do not eat a lot of foods that are high in solid fats, added sugars, or sodium. Maintain a healthy weight Body mass index (BMI) is used to identify weight problems. It estimates body fat based on height and weight. Your health care provider can help determine your BMI and help you achieve or maintain a healthy weight. Get regular exercise Get regular exercise. This is one of the most important things you can do for your health. Most adults should:  Exercise for at least 150 minutes each week. The exercise should increase your heart rate and make you sweat (moderate-intensity exercise).  Do strengthening exercises at least twice a week. This is in addition to the moderate-intensity exercise.  Spend less time sitting. Even light physical activity can be beneficial. Watch cholesterol and blood lipids Have your blood tested for lipids and cholesterol at 66 years of age, then have this test every 5 years. Have your cholesterol levels checked more often if:  Your lipid or cholesterol levels are high.  You are older than 66 years of age.  You are at high risk for heart disease. What should I know about cancer screening? Depending on your health history and family history, you may need to have cancer screening at various ages. This may include screening for:  Breast cancer.  Cervical cancer.  Colorectal cancer.  Skin cancer.  Lung cancer. What should I know about heart disease, diabetes, and high blood  pressure? Blood pressure and heart disease  High blood pressure causes heart disease and increases the risk of stroke. This is more likely to develop in people who have high blood pressure readings, are of African descent, or are overweight.  Have your blood pressure checked: ? Every 3-5 years if you are 18-39 years of age. ? Every year if you are 40 years old or older. Diabetes Have regular diabetes screenings. This checks your fasting blood sugar level. Have the screening done:  Once every three years after age 40 if you are at a normal weight and have a low risk for diabetes.  More often and at a younger age if you are overweight or have a high risk for diabetes. What should I know about preventing infection? Hepatitis B If you have a higher risk for hepatitis B, you should be screened for this virus. Talk with your health care provider to find out if you are at risk for hepatitis B infection. Hepatitis C Testing is recommended for:  Everyone born from 1945 through 1965.  Anyone with known risk factors for hepatitis C. Sexually transmitted infections (STIs)  Get screened for STIs, including gonorrhea and chlamydia, if: ? You are sexually active and are younger than 66 years of age. ? You are older than 66 years of age and your health care provider tells you that you are at risk for this type of infection. ? Your sexual activity has changed since you were last screened, and you are at increased risk for chlamydia or gonorrhea. Ask your health care provider if   you are at risk.  Ask your health care provider about whether you are at high risk for HIV. Your health care provider may recommend a prescription medicine to help prevent HIV infection. If you choose to take medicine to prevent HIV, you should first get tested for HIV. You should then be tested every 3 months for as long as you are taking the medicine. Pregnancy  If you are about to stop having your period (premenopausal) and  you may become pregnant, seek counseling before you get pregnant.  Take 400 to 800 micrograms (mcg) of folic acid every day if you become pregnant.  Ask for birth control (contraception) if you want to prevent pregnancy. Osteoporosis and menopause Osteoporosis is a disease in which the bones lose minerals and strength with aging. This can result in bone fractures. If you are 65 years old or older, or if you are at risk for osteoporosis and fractures, ask your health care provider if you should:  Be screened for bone loss.  Take a calcium or vitamin D supplement to lower your risk of fractures.  Be given hormone replacement therapy (HRT) to treat symptoms of menopause. Follow these instructions at home: Lifestyle  Do not use any products that contain nicotine or tobacco, such as cigarettes, e-cigarettes, and chewing tobacco. If you need help quitting, ask your health care provider.  Do not use street drugs.  Do not share needles.  Ask your health care provider for help if you need support or information about quitting drugs. Alcohol use  Do not drink alcohol if: ? Your health care provider tells you not to drink. ? You are pregnant, may be pregnant, or are planning to become pregnant.  If you drink alcohol: ? Limit how much you use to 0-1 drink a day. ? Limit intake if you are breastfeeding.  Be aware of how much alcohol is in your drink. In the [place] one drink equals one 12 oz bottle of beer (355 mL), one 5 oz glass of wine (148 mL), or one 1 oz glass of hard liquor (44 mL). General instructions  Schedule regular health, dental, and eye exams.  Stay current with your vaccines.  Tell your health care provider if: ? You often feel depressed. ? You have ever been abused or do not feel safe at home. Summary  Adopting a healthy lifestyle and getting preventive care are important in promoting health and wellness.  Follow your health care provider's instructions about healthy  diet, exercising, and getting tested or screened for diseases.  Follow your health care provider's instructions on monitoring your cholesterol and blood pressure. This information is not intended to replace advice given to you by your health care provider. Make sure you discuss any questions you have with your health care provider. Document Released: 09/19/2010 Document Revised: 02/27/2018 Document Reviewed: 02/27/2018 Elsevier Patient Education  2020 Elsevier Inc.  

## 2019-02-26 LAB — COMPREHENSIVE METABOLIC PANEL
ALT: 24 IU/L (ref 0–32)
AST: 30 IU/L (ref 0–40)
Albumin/Globulin Ratio: 1.9 (ref 1.2–2.2)
Albumin: 4.4 g/dL (ref 3.8–4.8)
Alkaline Phosphatase: 52 IU/L (ref 39–117)
BUN/Creatinine Ratio: 22 (ref 12–28)
BUN: 15 mg/dL (ref 8–27)
Bilirubin Total: 0.6 mg/dL (ref 0.0–1.2)
CO2: 23 mmol/L (ref 20–29)
Calcium: 9.7 mg/dL (ref 8.7–10.3)
Chloride: 101 mmol/L (ref 96–106)
Creatinine, Ser: 0.69 mg/dL (ref 0.57–1.00)
GFR calc Af Amer: 105 mL/min/{1.73_m2} (ref >59)
GFR calc non Af Amer: 91 mL/min/{1.73_m2} (ref >59)
Globulin, Total: 2.3 g/dL (ref 1.5–4.5)
Glucose: 109 mg/dL — ABNORMAL HIGH (ref 65–99)
Potassium: 3.9 mmol/L (ref 3.5–5.2)
Sodium: 141 mmol/L (ref 134–144)
Total Protein: 6.7 g/dL (ref 6.0–8.5)

## 2019-02-26 LAB — CBC WITH DIFFERENTIAL/PLATELET
Basophils Absolute: 0.1 x10E3/uL (ref 0.0–0.2)
Basos: 1 %
EOS (ABSOLUTE): 0.1 x10E3/uL (ref 0.0–0.4)
Eos: 2 %
Hematocrit: 38.3 % (ref 34.0–46.6)
Hemoglobin: 13.3 g/dL (ref 11.1–15.9)
Immature Grans (Abs): 0 x10E3/uL (ref 0.0–0.1)
Immature Granulocytes: 0 %
Lymphocytes Absolute: 1.5 x10E3/uL (ref 0.7–3.1)
Lymphs: 29 %
MCH: 35.9 pg — ABNORMAL HIGH (ref 26.6–33.0)
MCHC: 34.7 g/dL (ref 31.5–35.7)
MCV: 104 fL — ABNORMAL HIGH (ref 79–97)
Monocytes Absolute: 0.6 x10E3/uL (ref 0.1–0.9)
Monocytes: 11 %
Neutrophils Absolute: 2.9 x10E3/uL (ref 1.4–7.0)
Neutrophils: 57 %
Platelets: 187 x10E3/uL (ref 150–450)
RBC: 3.7 x10E6/uL — ABNORMAL LOW (ref 3.77–5.28)
RDW: 12.8 % (ref 11.7–15.4)
WBC: 5.2 x10E3/uL (ref 3.4–10.8)

## 2019-02-26 LAB — LIPID PANEL
Chol/HDL Ratio: 2.1 ratio (ref 0.0–4.4)
Cholesterol, Total: 203 mg/dL — ABNORMAL HIGH (ref 100–199)
HDL: 97 mg/dL (ref >39)
LDL Chol Calc (NIH): 91 mg/dL (ref 0–99)
Triglycerides: 87 mg/dL (ref 0–149)
VLDL Cholesterol Cal: 15 mg/dL (ref 5–40)

## 2019-02-26 LAB — TSH: TSH: 1.32 u[IU]/mL (ref 0.450–4.500)

## 2019-02-27 LAB — CYTOLOGY - PAP
Comment: NEGATIVE
Diagnosis: NEGATIVE
High risk HPV: NEGATIVE

## 2019-03-04 ENCOUNTER — Encounter: Payer: Self-pay | Admitting: *Deleted

## 2019-03-06 ENCOUNTER — Other Ambulatory Visit: Payer: Self-pay | Admitting: Physician Assistant

## 2019-03-06 DIAGNOSIS — B009 Herpesviral infection, unspecified: Secondary | ICD-10-CM

## 2019-03-23 ENCOUNTER — Other Ambulatory Visit: Payer: Self-pay | Admitting: Physician Assistant

## 2019-03-23 DIAGNOSIS — I10 Essential (primary) hypertension: Secondary | ICD-10-CM

## 2019-03-30 ENCOUNTER — Other Ambulatory Visit: Payer: Self-pay | Admitting: Physician Assistant

## 2019-03-30 DIAGNOSIS — M810 Age-related osteoporosis without current pathological fracture: Secondary | ICD-10-CM

## 2019-03-31 NOTE — Telephone Encounter (Signed)
Requested Prescriptions  Pending Prescriptions Disp Refills  . alendronate (FOSAMAX) 70 MG tablet [Pharmacy Med Name: ALENDRONATE SODIUM 70 MG TAB] 12 tablet 3    Sig: TAKE 1 TABLET (70 MG TOTAL) BY MOUTH EVERY 7 (SEVEN) DAYS. TAKE WITH A FULL GLASS OF WATER ON AN EMPTY STOMACH.     Endocrinology:  Bisphosphonates Failed - 03/30/2019  9:05 AM      Failed - Vitamin D in normal range and within 360 days    No results found for: EX9371IR6, [id number], [id number], 25OHVITD3, 25OHVITD2, 25OHVITD3, 25OHVITD2, 25OHVITD1, 25OHVITD2, 25OHVITD3, VD25OH       Passed - Ca in normal range and within 360 days    Calcium  Date Value Ref Range Status  02/25/2019 9.7 8.7 - 10.3 mg/dL Final         Passed - Valid encounter within last 12 months    Recent Outpatient Visits          1 month ago Annual physical exam   [facility] [name] M, PA-C   5 months ago Essential (primary) hypertension   [facility] [name] M, PA-C   11 months ago Essential (primary) hypertension   [employer], [place], PA-C   1 year ago Chest wall contusion, right, initial encounter   [facility] [place], [place], [place]   1 year ago Hypercholesteremia   [facility] [place], [place], [place]

## 2019-04-12 ENCOUNTER — Other Ambulatory Visit: Payer: Self-pay | Admitting: Physician Assistant

## 2019-04-12 DIAGNOSIS — I7300 Raynaud's syndrome without gangrene: Secondary | ICD-10-CM

## 2019-04-12 DIAGNOSIS — I10 Essential (primary) hypertension: Secondary | ICD-10-CM

## 2019-05-15 ENCOUNTER — Other Ambulatory Visit: Payer: Self-pay | Admitting: Physician Assistant

## 2019-05-15 DIAGNOSIS — B009 Herpesviral infection, unspecified: Secondary | ICD-10-CM

## 2019-05-15 NOTE — Telephone Encounter (Signed)
Requested medication (s) are due for refill today: yes  Requested medication (s) are on the active medication list: yes  Last refill:  03/06/2019  Future visit scheduled: no  Notes to clinic:  previously d/c'd    Requested Prescriptions  Pending Prescriptions Disp Refills   valACYclovir (VALTREX) 1000 MG tablet [Pharmacy Med Name: VALACYCLOVIR HCL 1 GRAM TABLET] 90 tablet 0    Sig: TAKE 1 TABLET BY MOUTH EVERY DAY      Antimicrobials:  Antiviral Agents - Anti-Herpetic Passed - 05/15/2019  1:56 PM      Passed - Valid encounter within last 12 months    Recent Outpatient Visits           2 months ago Annual physical exam   [facility] [name] M, PA-C   6 months ago Essential (primary) hypertension   [employer], [place], PA-C   1 year ago Essential (primary) hypertension   [employer], [place], PA-C   1 year ago Chest wall contusion, right, initial encounter   [facility] [place], [place], [place]   1 year ago Hypercholesteremia   [facility] [place], [place], [place]

## 2019-06-25 ENCOUNTER — Telehealth: Payer: Self-pay

## 2019-06-25 NOTE — Telephone Encounter (Signed)
LMTCB, patient has not scheduled her appt for follow up on colonoscopy. PEC may advised and give number [facility] [phone].

## 2019-06-25 NOTE — Telephone Encounter (Signed)
Pt has returned call and states she has an appt with [facility] on Jul 24, 2019

## 2019-08-31 ENCOUNTER — Other Ambulatory Visit: Payer: Self-pay | Admitting: Physician Assistant

## 2019-08-31 DIAGNOSIS — I7300 Raynaud's syndrome without gangrene: Secondary | ICD-10-CM

## 2019-08-31 DIAGNOSIS — I10 Essential (primary) hypertension: Secondary | ICD-10-CM

## 2019-10-02 ENCOUNTER — Other Ambulatory Visit: Payer: Self-pay | Admitting: Physician Assistant

## 2019-10-02 DIAGNOSIS — I10 Essential (primary) hypertension: Secondary | ICD-10-CM

## 2019-10-02 DIAGNOSIS — I7300 Raynaud's syndrome without gangrene: Secondary | ICD-10-CM

## 2019-10-16 ENCOUNTER — Other Ambulatory Visit: Payer: Self-pay | Admitting: Physician Assistant

## 2019-10-16 DIAGNOSIS — I10 Essential (primary) hypertension: Secondary | ICD-10-CM

## 2019-10-16 DIAGNOSIS — I7300 Raynaud's syndrome without gangrene: Secondary | ICD-10-CM

## 2019-10-19 ENCOUNTER — Other Ambulatory Visit: Payer: Self-pay | Admitting: Physician Assistant

## 2019-10-19 DIAGNOSIS — B009 Herpesviral infection, unspecified: Secondary | ICD-10-CM

## 2019-10-19 NOTE — Telephone Encounter (Signed)
Requested Prescriptions  Pending Prescriptions Disp Refills  . valACYclovir (VALTREX) 1000 MG tablet [Pharmacy Med Name: VALACYCLOVIR HCL 1 GRAM TABLET] 90 tablet 0    Sig: TAKE 1 TABLET BY MOUTH EVERY DAY     Antimicrobials:  Antiviral Agents - Anti-Herpetic Passed - 10/19/2019  7:37 AM      Passed - Valid encounter within last 12 months    Recent Outpatient Visits          7 months ago Annual physical exam   [facility] [name] M, PA-C   11 months ago Essential (primary) hypertension   [employer], [place], PA-C   1 year ago Essential (primary) hypertension   [employer], [place], [place]   2 years ago Chest wall contusion, right, initial encounter   [facility] [place], [place], [place]   2 years ago Hypercholesteremia   [facility] [place], [place], [place]

## 2019-11-03 ENCOUNTER — Other Ambulatory Visit: Payer: Self-pay | Admitting: Physician Assistant

## 2019-11-03 DIAGNOSIS — Z1231 Encounter for screening mammogram for malignant neoplasm of breast: Secondary | ICD-10-CM

## 2019-11-04 ENCOUNTER — Other Ambulatory Visit: Payer: Self-pay

## 2019-11-04 ENCOUNTER — Ambulatory Visit
Admission: RE | Admit: 2019-11-04 | Discharge: 2019-11-04 | Disposition: A | Discharge status: Home / Self Care | Payer: Medicare PPO | Source: Ambulatory Visit | Attending: Physician Assistant | Admitting: Physician Assistant

## 2019-11-04 DIAGNOSIS — Z1231 Encounter for screening mammogram for malignant neoplasm of breast: Secondary | ICD-10-CM | POA: Insufficient documentation

## 2019-11-10 ENCOUNTER — Other Ambulatory Visit: Payer: Self-pay | Admitting: Physician Assistant

## 2019-11-10 DIAGNOSIS — I10 Essential (primary) hypertension: Secondary | ICD-10-CM

## 2019-11-10 NOTE — Telephone Encounter (Signed)
According to last OV note (annual physical) on 03/17/19, noted to return in 1 year for CPE/HTN. Will refill according to this note for 3 months.

## 2019-11-11 ENCOUNTER — Other Ambulatory Visit: Payer: Self-pay

## 2019-11-11 ENCOUNTER — Other Ambulatory Visit
Admission: RE | Admit: 2019-11-11 | Discharge: 2019-11-11 | Disposition: A | Discharge status: Home / Self Care | Payer: Medicare PPO | Source: Ambulatory Visit | Attending: Internal Medicine | Admitting: Internal Medicine

## 2019-11-11 DIAGNOSIS — Z20822 Contact with and (suspected) exposure to covid-19: Secondary | ICD-10-CM | POA: Diagnosis not present

## 2019-11-11 DIAGNOSIS — Z01812 Encounter for preprocedural laboratory examination: Secondary | ICD-10-CM | POA: Diagnosis present

## 2019-11-12 ENCOUNTER — Encounter: Payer: Self-pay | Admitting: Internal Medicine

## 2019-11-12 LAB — SARS CORONAVIRUS 2 (TAT 6-24 HRS): SARS Coronavirus 2: NEGATIVE

## 2019-11-13 ENCOUNTER — Other Ambulatory Visit: Payer: Self-pay

## 2019-11-13 ENCOUNTER — Encounter: Payer: Self-pay | Admitting: Internal Medicine

## 2019-11-13 ENCOUNTER — Ambulatory Visit: Payer: Medicare PPO | Admitting: Certified Registered"

## 2019-11-13 ENCOUNTER — Encounter
Admission: RE | Disposition: A | Discharge status: Home / Self Care | Payer: Self-pay | Source: Home / Self Care | Attending: Internal Medicine

## 2019-11-13 ENCOUNTER — Ambulatory Visit
Admission: RE | Admit: 2019-11-13 | Discharge: 2019-11-13 | Disposition: A | Discharge status: Home / Self Care | Payer: Medicare PPO | Attending: Internal Medicine | Admitting: Internal Medicine

## 2019-11-13 DIAGNOSIS — E785 Hyperlipidemia, unspecified: Secondary | ICD-10-CM | POA: Insufficient documentation

## 2019-11-13 DIAGNOSIS — I10 Essential (primary) hypertension: Secondary | ICD-10-CM | POA: Insufficient documentation

## 2019-11-13 DIAGNOSIS — M069 Rheumatoid arthritis, unspecified: Secondary | ICD-10-CM | POA: Insufficient documentation

## 2019-11-13 DIAGNOSIS — Z86010 Personal history of colonic polyps: Secondary | ICD-10-CM | POA: Insufficient documentation

## 2019-11-13 DIAGNOSIS — Z79899 Other long term (current) drug therapy: Secondary | ICD-10-CM | POA: Diagnosis not present

## 2019-11-13 DIAGNOSIS — Z1211 Encounter for screening for malignant neoplasm of colon: Secondary | ICD-10-CM | POA: Insufficient documentation

## 2019-11-13 DIAGNOSIS — K314 Gastric diverticulum: Secondary | ICD-10-CM | POA: Diagnosis not present

## 2019-11-13 DIAGNOSIS — I7300 Raynaud's syndrome without gangrene: Secondary | ICD-10-CM | POA: Insufficient documentation

## 2019-11-13 DIAGNOSIS — K2100 Gastro-esophageal reflux disease with esophagitis, without bleeding: Secondary | ICD-10-CM | POA: Insufficient documentation

## 2019-11-13 DIAGNOSIS — Z7983 Long term (current) use of bisphosphonates: Secondary | ICD-10-CM | POA: Diagnosis not present

## 2019-11-13 DIAGNOSIS — K642 Third degree hemorrhoids: Secondary | ICD-10-CM | POA: Insufficient documentation

## 2019-11-13 DIAGNOSIS — Z7982 Long term (current) use of aspirin: Secondary | ICD-10-CM | POA: Diagnosis not present

## 2019-11-13 DIAGNOSIS — K5730 Diverticulosis of large intestine without perforation or abscess without bleeding: Secondary | ICD-10-CM | POA: Insufficient documentation

## 2019-11-13 DIAGNOSIS — K2970 Gastritis, unspecified, without bleeding: Secondary | ICD-10-CM | POA: Diagnosis not present

## 2019-11-13 DIAGNOSIS — Z8719 Personal history of other diseases of the digestive system: Secondary | ICD-10-CM | POA: Diagnosis present

## 2019-11-13 HISTORY — DX: Raynaud's syndrome without gangrene: I73.00

## 2019-11-13 HISTORY — PX: COLONOSCOPY WITH PROPOFOL: SHX5780

## 2019-11-13 HISTORY — PX: ESOPHAGOGASTRODUODENOSCOPY (EGD) WITH PROPOFOL: SHX5813

## 2019-11-13 LAB — HM COLONOSCOPY

## 2019-11-13 SURGERY — ESOPHAGOGASTRODUODENOSCOPY (EGD) WITH PROPOFOL
Anesthesia: General

## 2019-11-13 MED ORDER — SODIUM CHLORIDE 0.9 % IV SOLN: INTRAVENOUS | Status: DC

## 2019-11-13 MED ORDER — LIDOCAINE HCL (CARDIAC) PF 100 MG/5ML IV SOSY
PREFILLED_SYRINGE | INTRAVENOUS | Status: DC | PRN
  Administered 2019-11-13: 50 mg via INTRAVENOUS

## 2019-11-13 MED ORDER — GLYCOPYRROLATE 0.2 MG/ML IJ SOLN
INTRAMUSCULAR | Status: DC | PRN
  Administered 2019-11-13: .2 mg via INTRAVENOUS

## 2019-11-13 MED ORDER — PROPOFOL 10 MG/ML IV BOLUS
INTRAVENOUS | Status: DC | PRN
  Administered 2019-11-13 (×4): 50 mg via INTRAVENOUS

## 2019-11-13 MED ORDER — PROPOFOL 500 MG/50ML IV EMUL
INTRAVENOUS | Status: DC | PRN
  Administered 2019-11-13: 140 ug/kg/min via INTRAVENOUS

## 2019-11-13 MED ORDER — PROPOFOL 500 MG/50ML IV EMUL
INTRAVENOUS | Status: AC
  Filled 2019-11-13: qty 50

## 2019-11-13 NOTE — Interval H&P Note (Signed)
History and Physical Interval Note:  11/13/2019 9:52 AM  Courtney Rodgers  has presented today for surgery, with the diagnosis of ESOPHAGEAL VARICES,HX.OF COLON POLYPS.  The various methods of treatment have been discussed with the patient and family. After consideration of risks, benefits and other options for treatment, the patient has consented to  Procedure(s): ESOPHAGOGASTRODUODENOSCOPY (EGD) WITH PROPOFOL (N/A) COLONOSCOPY WITH PROPOFOL (N/A) as a surgical intervention.  The patient's history has been reviewed, patient examined, no change in status, stable for surgery.  I have reviewed the patient's chart and labs.  Questions were answered to the patient's satisfaction.     [place], [place]

## 2019-11-13 NOTE — H&P (Signed)
Outpatient short stay form Pre-procedure 11/13/2019 9:51 AM Courtney Rodgers K. Norma Fredrickson, M.D.  Primary Physician: :[name], PA-C  Reason for visit: ? History of esophageal varices, personal history of serrated adenoma (2013)  History of present illness:  As above. Patient denies intractable heartburn, dysphagia, hemetemesis, abdominal pain, nausea or vomiting.                            Patient presents for colonoscopy for a personal hx of colon polyps. The patient denies abdominal pain, abnormal weight loss or rectal bleeding.      Current Facility-Administered Medications:  .  0.9 %  sodium chloride infusion, , Intravenous, Continuous, Daeshaun Specht, Boykin Nearing, MD  Medications Prior to Admission  Medication Sig Dispense Refill Last Dose  . alendronate (FOSAMAX) 70 MG tablet TAKE 1 TABLET (70 MG TOTAL) BY MOUTH EVERY 7 (SEVEN) DAYS. TAKE WITH A FULL GLASS OF WATER ON AN EMPTY STOMACH. 12 tablet 3 Past Week at Unknown time  . Aspirin 81 MG EC tablet 1 tablet daily.   11/12/2019 at Unknown time  . atorvastatin (LIPITOR) 10 MG tablet TAKE 1 TABLET BY MOUTH EVERY DAY 90 tablet 3 11/12/2019 at Unknown time  . azelastine (OPTIVAR) 0.05 % ophthalmic solution INSTILL 1 DROP INTO BOTH EYES TWICE A DAY  3 11/12/2019 at Unknown time  . Biotin 5000 MCG CAPS 1 capsule daily.   Past Week at Unknown time  . Calcium Carbonate-Vit D-Min (CALCIUM 1200 PO) 1 tablet daily.   Past Week at Unknown time  . carvedilol (COREG) 12.5 MG tablet Take 12.5 mg by mouth 2 (two) times daily with a meal.   11/13/2019 at Unknown time  . COMBIGAN 0.2-0.5 % ophthalmic solution 1 DROP IN BOTH EYES TWICE A DAY  6 11/12/2019 at Unknown time  . folic acid (FOLVITE) 800 MCG tablet 1 tablet daily.   Past Week at Unknown time  . latanoprost (XALATAN) 0.005 % ophthalmic solution    11/12/2019 at Unknown time  . methotrexate 2.5 MG tablet 7 tablets once a week.    Past Week at Unknown time  . Omega-3 Fatty Acids (FISH OIL BURP-LESS) 1000 MG CAPS  Take 1 capsule by mouth daily.    Past Week at Unknown time  . triamterene-hydrochlorothiazide (MAXZIDE-25) 37.5-25 MG tablet TAKE 1 TABLET BY MOUTH EVERY DAY 90 tablet 0 11/13/2019 at Unknown time  . valACYclovir (VALTREX) 1000 MG tablet TAKE 1 TABLET BY MOUTH EVERY DAY 90 tablet 0 11/12/2019 at Unknown time  . amLODipine (NORVASC) 5 MG tablet TAKE 1 TABLET BY MOUTH EVERY DAY (Patient not taking: TAKE 1 TABLET BY MOUTH EVERY DAY) 30 tablet 0 Not Taking  . Biotin 5000 MCG CAPS Take by mouth.        No Known Allergies   Past Medical History:  Diagnosis Date  . Allergy   . Diverticulosis 12/25/2013  . Hyperlipidemia   . Hypertension   . Raynaud's disease   . Rheumatoid arthritis (HCC) 01/28/2014   [facility]    Review of systems:  Otherwise negative.    Physical Exam  Gen: Alert, oriented. Appears stated age.  HEENT: [place]/AT. PERRLA. Lungs: CTA, no wheezes. CV: RR nl S1, S2. Abd: soft, benign, no masses. BS+ Ext: No edema. Pulses 2+    Planned procedures: Proceed with EGD and colonoscopy. The patient understands the nature of the planned procedure, indications, risks, alternatives and potential complications including but not limited to bleeding, infection, perforation, damage  to internal organs and possible oversedation/side effects from anesthesia. The patient agrees and gives consent to proceed.  Please refer to procedure notes for findings, recommendations and patient disposition/instructions.     [name] K. Norma Fredrickson, M.D. Gastroenterology 11/13/2019  9:51 AM

## 2019-11-13 NOTE — Transfer of Care (Signed)
Immediate Anesthesia Transfer of Care Note  Patient: Courtney Rodgers  Procedure(s) Performed: ESOPHAGOGASTRODUODENOSCOPY (EGD) WITH PROPOFOL (N/A ) COLONOSCOPY WITH PROPOFOL (N/A )  Patient Location: PACU and Endoscopy Unit  Anesthesia Type:General  Level of Consciousness: drowsy  Airway & Oxygen Therapy: Patient Spontanous Breathing  Post-op Assessment: Report given to RN  Post vital signs: stable  Last Vitals:  Vitals Value Taken Time  BP    Temp    Pulse    Resp    SpO2      Last Pain:  Vitals:   11/13/19 0923  TempSrc: Temporal  PainSc: 0-No pain         Complications: No complications documented.

## 2019-11-13 NOTE — Anesthesia Preprocedure Evaluation (Signed)
Anesthesia Evaluation  Patient identified by MRN, date of birth, ID band Patient awake    Reviewed: Allergy & Precautions, NPO status , Patient's Chart, lab work & pertinent test results  Airway Mallampati: III       Dental   Pulmonary former smoker,           Cardiovascular hypertension, Pt. on medications + dysrhythmias Atrial Fibrillation      Neuro/Psych negative neurological ROS  negative psych ROS   GI/Hepatic   Endo/Other    Renal/GU   Female GU complaint     Musculoskeletal  (+) Arthritis , Rheumatoid disorders,    Abdominal   Peds negative pediatric ROS (+)  Hematology negative hematology ROS (+)   Anesthesia Other Findings Past Medical History: No date: Allergy 12/25/2013: Diverticulosis No date: Hyperlipidemia No date: Hypertension No date: Raynaud's disease 01/28/2014: Rheumatoid arthritis (HCC)     Comment:  [facility]  Reproductive/Obstetrics                             Anesthesia Physical Anesthesia Plan  ASA: III  Anesthesia Plan: General   Post-op Pain Management:    Induction: Intravenous  PONV Risk Score and Plan:   Airway Management Planned: Nasal Cannula and Natural Airway  Additional Equipment:   Intra-op Plan:   Post-operative Plan:   Informed Consent: I have reviewed the patients History and Physical, chart, labs and discussed the procedure including the risks, benefits and alternatives for the proposed anesthesia with the patient or authorized representative who has indicated his/her understanding and acceptance.     Dental advisory given  Plan Discussed with: CRNA and Surgeon  Anesthesia Plan Comments:         Anesthesia Quick Evaluation

## 2019-11-13 NOTE — Op Note (Signed)
[facility] Gastroenterology Patient Name: Courtney Rodgers Procedure Date: 11/13/2019 9:55 AM MRN: [id number] Account #: [id number] Date of Birth: June 23, 1950 Admit Type: Outpatient Age: 69 Room: [facility] ENDO ROOM 3 Gender: Female Note Status: Finalized Procedure:             Upper GI endoscopy Indications:           Follow-up of esophageal varices Providers:             Royce Macadamia K. Norma Fredrickson MD, MD Referring MD:          Lavella Hammock. Jodi Marble (Referring MD) Medicines:             Propofol per Anesthesia Complications:         No immediate complications. Procedure:             Pre-Anesthesia Assessment:                        - Prior to the procedure, a History and Physical was                         performed, and patient medications, allergies and                         sensitivities were reviewed. The patient's tolerance                         of previous anesthesia was reviewed.                        - The risks and benefits of the procedure and the                         sedation options and risks were discussed with the                         patient. All questions were answered and informed                         consent was obtained.                        - Patient identification and proposed procedure were                         verified prior to the procedure by the nurse. The                         procedure was verified in the procedure room.                        - ASA Grade Assessment: III - A patient with severe                         systemic disease.                        - After reviewing the risks and benefits, the patient  was deemed in satisfactory condition to undergo the                         procedure.                        After obtaining informed consent, the endoscope was                         passed under direct vision. Throughout the procedure,                         the patient's blood pressure, pulse,  and oxygen                         saturations were monitored continuously. The Endoscope                         was introduced through the mouth, and advanced to the                         third part of duodenum. The upper GI endoscopy was                         accomplished without difficulty. The patient tolerated                         the procedure well. Findings:      The Z-line was irregular and was found 36 to 37 cm from the incisors.       Mucosa was biopsied with a cold forceps for histology. One specimen       bottle was sent to pathology.      There is no endoscopic evidence of varices in the entire esophagus.      Patchy minimal inflammation characterized by erythema was found in the       gastric antrum.      A 15 mm non-bleeding diverticulum was found in the gastric fundus.      The examined duodenum was normal.      The exam was otherwise without abnormality. Impression:            - Z-line irregular, 36 to 37 cm from the incisors.                         Biopsied.                        - Gastritis.                        - Gastric diverticulum.                        - Normal examined duodenum.                        - The examination was otherwise normal. Recommendation:        - Await pathology results.                        - Repeat upper endoscopy for surveillance based on  pathology results.                        - Proceed with colonoscopy Procedure Code(s):     --- Professional ---                        [phone], Esophagogastroduodenoscopy, flexible,                         transoral; with biopsy, single or multiple Diagnosis Code(s):     --- Professional ---                        I85.00, Esophageal varices without bleeding                        K31.4, Gastric diverticulum                        K29.70, Gastritis, unspecified, without bleeding                        K22.8, Other specified diseases of esophagus CPT copyright 2019  American Medical Association. All rights reserved. The codes documented in this report are preliminary and upon coder review may  be revised to meet current compliance requirements. Stanton Kidney MD, MD 11/13/2019 10:18:16 AM This report has been signed electronically. Number of Addenda: 0 Note Initiated On: 11/13/2019 9:55 AM Estimated Blood Loss:  Estimated blood loss: none.      [facility]

## 2019-11-13 NOTE — Op Note (Signed)
[facility] Gastroenterology Patient Name: Courtney Rodgers Procedure Date: 11/13/2019 9:54 AM MRN: [id number] Account #: [id number] Date of Birth: September 19, 1950 Admit Type: Outpatient Age: 69 Room: [facility] ENDO ROOM 3 Gender: Female Note Status: Finalized Procedure:             Colonoscopy Indications:           High risk colon cancer surveillance: Personal history                         of sessile serrated colon polyp (less than 10 mm in                         size) with no dysplasia Providers:             Boykin Nearing. Norma Fredrickson MD, MD Referring MD:          Lavella Hammock. Jodi Marble (Referring MD) Medicines:             Propofol per Anesthesia Complications:         No immediate complications. Procedure:             Pre-Anesthesia Assessment:                        - The risks and benefits of the procedure and the                         sedation options and risks were discussed with the                         patient. All questions were answered and informed                         consent was obtained.                        - Patient identification and proposed procedure were                         verified prior to the procedure by the nurse. The                         procedure was verified in the procedure room.                        - ASA Grade Assessment: III - A patient with severe                         systemic disease.                        - After reviewing the risks and benefits, the patient                         was deemed in satisfactory condition to undergo the                         procedure.                        After obtaining informed  consent, the colonoscope was                         passed under direct vision. Throughout the procedure,                         the patient's blood pressure, pulse, and oxygen                         saturations were monitored continuously. The                         Colonoscope was introduced through the anus  and                         advanced to the the cecum, identified by appendiceal                         orifice and ileocecal valve. The colonoscopy was                         performed without difficulty. The patient tolerated                         the procedure well. The quality of the bowel                         preparation was excellent. The ileocecal valve,                         appendiceal orifice, and rectum were photographed. Findings:      The perianal exam findings include internal hemorrhoids that prolapse       with straining, but require manual replacement into the anal canal       (Grade III).      Non-bleeding internal hemorrhoids were found during retroflexion. The       hemorrhoids were Grade II (internal hemorrhoids that prolapse but reduce       spontaneously).      Multiple small and large-mouthed diverticula were found in the sigmoid       colon.      The exam was otherwise without abnormality. Impression:            - Internal hemorrhoids that prolapse with straining,                         but require manual replacement into the anal canal                         (Grade III) found on perianal exam.                        - Non-bleeding internal hemorrhoids.                        - Diverticulosis in the sigmoid colon.                        - The examination was otherwise normal.                        -  No specimens collected. Recommendation:        - Patient has a contact number available for                         emergencies. The signs and symptoms of potential                         delayed complications were discussed with the patient.                         Return to normal activities tomorrow. Written                         discharge instructions were provided to the patient.                        - Await pathology results from EGD, also performed                         today.                        - Resume previous diet.                         - Continue present medications.                        - Repeat colonoscopy in 5 years for surveillance.                        - Return to nurse practitioner as previously scheduled.                        - The findings and recommendations were discussed with                         the patient. Procedure Code(s):     --- Professional ---                        Z6109, Colorectal cancer screening; colonoscopy on                         individual at high risk Diagnosis Code(s):     --- Professional ---                        K57.30, Diverticulosis of large intestine without                         perforation or abscess without bleeding                        K64.2, Third degree hemorrhoids                        Z86.010, Personal history of colonic polyps CPT copyright 2019 American Medical Association. All rights reserved. The codes documented in this report are preliminary and upon coder review may  be revised to meet current compliance requirements. Stanton Kidney MD, MD 11/13/2019 10:36:23 AM This  report has been signed electronically. Number of Addenda: 0 Note Initiated On: 11/13/2019 9:54 AM Scope Withdrawal Time: 0 hours 6 minutes 32 seconds  Total Procedure Duration: 0 hours 13 minutes 33 seconds  Estimated Blood Loss:  Estimated blood loss: none.      [facility]

## 2019-11-14 ENCOUNTER — Encounter: Payer: Self-pay | Admitting: Internal Medicine

## 2019-11-14 LAB — SURGICAL PATHOLOGY

## 2019-11-14 NOTE — Anesthesia Postprocedure Evaluation (Signed)
Anesthesia Post Note  Patient: Courtney Rodgers  Procedure(s) Performed: ESOPHAGOGASTRODUODENOSCOPY (EGD) WITH PROPOFOL (N/A ) COLONOSCOPY WITH PROPOFOL (N/A )  Patient location during evaluation: Endoscopy Anesthesia Type: General Level of consciousness: awake and alert and oriented Pain management: pain level controlled Vital Signs Assessment: post-procedure vital signs reviewed and stable Respiratory status: spontaneous breathing Cardiovascular status: blood pressure returned to baseline Anesthetic complications: no   No complications documented.   Last Vitals:  Vitals:   11/13/19 1100 11/13/19 1108  BP: (!) 151/73 (!) 154/74  Pulse:    Resp: 15 12  Temp:    SpO2: 100%     Last Pain:  Vitals:   11/14/19 0752  TempSrc:   PainSc: 0-No pain                 Makeba Delcastillo

## 2019-12-08 ENCOUNTER — Telehealth: Payer: Self-pay | Admitting: Physician Assistant

## 2019-12-08 NOTE — Telephone Encounter (Signed)
Copied from CRM [id number]. Topic: Medicare AWV >> Dec 08, 2019 12:45 PM Geraldine Contras wrote: Left message for patient to call back and schedule Medicare Annual Wellness Visit (AWV) either virtually or in office  Last AWV due 05/19/2018 AWV-I  Please schedule at anytime with [facility] Advisor.  If any questions, please contact me at [phone]

## 2019-12-09 ENCOUNTER — Other Ambulatory Visit: Payer: Self-pay | Admitting: Physician Assistant

## 2019-12-09 DIAGNOSIS — E7800 Pure hypercholesterolemia, unspecified: Secondary | ICD-10-CM

## 2019-12-09 NOTE — Telephone Encounter (Signed)
L.O.V. was on 02/25/2019 and patient was advised to f/u in 1 year for CPE& HTN. Patient scheduled appointment for 02/26/20 @ 11:00 am. Medication send into pharmacy.

## 2020-01-15 ENCOUNTER — Other Ambulatory Visit: Payer: Self-pay | Admitting: Physician Assistant

## 2020-01-15 DIAGNOSIS — I10 Essential (primary) hypertension: Secondary | ICD-10-CM

## 2020-01-15 DIAGNOSIS — I7300 Raynaud's syndrome without gangrene: Secondary | ICD-10-CM

## 2020-02-08 ENCOUNTER — Other Ambulatory Visit: Payer: Self-pay | Admitting: Physician Assistant

## 2020-02-08 DIAGNOSIS — I7300 Raynaud's syndrome without gangrene: Secondary | ICD-10-CM

## 2020-02-08 DIAGNOSIS — I10 Essential (primary) hypertension: Secondary | ICD-10-CM

## 2020-02-18 ENCOUNTER — Other Ambulatory Visit: Payer: Self-pay | Admitting: Physician Assistant

## 2020-02-18 DIAGNOSIS — I10 Essential (primary) hypertension: Secondary | ICD-10-CM

## 2020-02-18 DIAGNOSIS — I7300 Raynaud's syndrome without gangrene: Secondary | ICD-10-CM

## 2020-02-22 ENCOUNTER — Other Ambulatory Visit: Payer: Self-pay | Admitting: Physician Assistant

## 2020-02-22 DIAGNOSIS — B009 Herpesviral infection, unspecified: Secondary | ICD-10-CM

## 2020-02-22 NOTE — Telephone Encounter (Signed)
Requested Prescriptions  Pending Prescriptions Disp Refills  . valACYclovir (VALTREX) 1000 MG tablet [Pharmacy Med Name: VALACYCLOVIR HCL 1 GRAM TABLET] 90 tablet 0    Sig: TAKE 1 TABLET BY MOUTH EVERY DAY     Antimicrobials:  Antiviral Agents - Anti-Herpetic Failed - 02/22/2020  9:34 AM      Failed - Valid encounter within last 12 months    Recent Outpatient Visits          12 months ago Annual physical exam   [facility] Trey Sailors, PA-C   1 year ago Essential (primary) hypertension   [facility] [place], Lavella Hammock, PA-C   1 year ago Essential (primary) hypertension   [employer], [place], PA-C   2 years ago Chest wall contusion, right, initial encounter   [facility] [place], [place], [place]   2 years ago Hypercholesteremia   [facility] [place], Lavella Hammock, [place]      Future Appointments            In 4 days Trey Sailors, PA-C [employer], [facility]

## 2020-02-26 ENCOUNTER — Other Ambulatory Visit: Payer: Self-pay

## 2020-02-26 ENCOUNTER — Encounter: Payer: Self-pay | Admitting: Physician Assistant

## 2020-02-26 ENCOUNTER — Ambulatory Visit (INDEPENDENT_AMBULATORY_CARE_PROVIDER_SITE_OTHER): Payer: Medicare PPO | Admitting: Physician Assistant

## 2020-02-26 VITALS — BP 120/65 | HR 68 | Temp 98.1°F | Resp 16 | Ht 64.0 in | Wt 151.3 lb

## 2020-02-26 DIAGNOSIS — I10 Essential (primary) hypertension: Secondary | ICD-10-CM

## 2020-02-26 DIAGNOSIS — M810 Age-related osteoporosis without current pathological fracture: Secondary | ICD-10-CM

## 2020-02-26 DIAGNOSIS — Z0000 Encounter for general adult medical examination without abnormal findings: Secondary | ICD-10-CM | POA: Diagnosis not present

## 2020-02-26 DIAGNOSIS — E7800 Pure hypercholesterolemia, unspecified: Secondary | ICD-10-CM

## 2020-02-26 DIAGNOSIS — K22719 Barrett's esophagus with dysplasia, unspecified: Secondary | ICD-10-CM | POA: Diagnosis not present

## 2020-02-26 NOTE — Patient Instructions (Addendum)
Omeprazole 40 mg daily for reflux/esophageal - take it 30 minutes before a meal on empty stomach   Ask insurance company about Shingrix for shingles

## 2020-02-26 NOTE — Progress Notes (Signed)
Complete physical exam   Patient: Courtney Rodgers   DOB: [birth year]   69 y.o. Female  MRN: [id number] Visit Date: 02/26/2020  Today's healthcare provider: Trey Sailors, PA-C   Chief Complaint  Patient presents with  . Annual Exam  . Hypertension   Subjective    Courtney Rodgers is a 69 y.o. female who presents today for a complete physical exam.  She reports consuming a general diet. She reports that she exercises at least 4x weekly.  She generally feels well. She reports sleeping well. She does not have additional problems to discuss today.  HPI  Hypertension, follow-up  BP Readings from Last 3 Encounters:  02/26/20 120/65  11/13/19 (!) 154/74  02/25/19 124/78   Wt Readings from Last 3 Encounters:  02/26/20 151 lb 4.8 oz (68.6 kg)  11/13/19 146 lb (66.2 kg)  02/25/19 156 lb (70.8 kg)     She was last seen for hypertension 1 years ago.  BP at that visit was 124/78. Management since that visit includes continue current medication. 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  She reports good compliance with treatment. She is not having side effects.  She is following a Regular diet. She is exercising. She does not smoke.  Use of agents associated with hypertension: none.   Outside blood pressures are not being checked. Symptoms: No chest pain No chest pressure  No palpitations No syncope  No dyspnea No orthopnea  No paroxysmal nocturnal dyspnea No lower extremity edema   Pertinent labs: Lab Results  Component Value Date   CHOL 203 (H) 02/25/2019   HDL 97 02/25/2019   LDLCALC 91 02/25/2019   TRIG 87 02/25/2019   CHOLHDL 2.1 02/25/2019   Lab Results  Component Value Date   NA 141 02/25/2019   K 3.9 02/25/2019   CREATININE 0.69 02/25/2019   GFRNONAA 91 02/25/2019   GFRAA 105 02/25/2019   GLUCOSE 109 (H) 02/25/2019     The 10-year ASCVD risk score Denman George DC Jr., et al., 2013) is:  8.5%    Lipid/Cholesterol, Follow-up  Last lipid panel Other pertinent labs  Lab Results  Component Value Date   CHOL 203 (H) 02/25/2019   HDL 97 02/25/2019   LDLCALC 91 02/25/2019   TRIG 87 02/25/2019   CHOLHDL 2.1 02/25/2019   Lab Results  Component Value Date   ALT 24 02/25/2019   AST 30 02/25/2019   PLT 187 02/25/2019   TSH 1.320 02/25/2019     She was last seen for this 1 years ago.  Management since that visit includes continue statin.  She reports excellent compliance with treatment. She is not having side effects.   Symptoms: No chest pain No chest pressure/discomfort  No dyspnea No lower extremity edema  No numbness or tingling of extremity No orthopnea  No palpitations No paroxysmal nocturnal dyspnea  No speech difficulty No syncope   Current diet: in general, a "healthy" diet   Current exercise: aerobics  The 10-year ASCVD risk score Denman George DC Jr., et al., 2013) is: 8.5%  ---------------------------------------------------------------------------------------------------  Insomnia: wakes up frequently throughout the night, wakes up three times throughout the night. - Doesn't feel tired when she gets up. Gets up at 5:30 -6 AM. Feels well rested.   Osteoporosis: She has been taking fosamax for ~ 2 years, last DEXA 03/2018. Due for repeat DEXA 03/2020.  In the interim, she had colonoscopy and EGD  on 11/13/2019. EGD showed minute focus of intestinal metaplasia thought to represent Barrett's esophagus. Patient is not taking PPI. Denies symptoms of heartburn. Recommendations to repeat EGD in 3 years and colonoscopy in 5 years .  Past Medical History:  Diagnosis Date  . Allergy   . Diverticulosis 12/25/2013  . Hyperlipidemia   . Hypertension   . Raynaud's disease   . Rheumatoid arthritis (HCC) 01/28/2014   [facility]   Past Surgical History:  Procedure Laterality Date  . APPENDECTOMY  1980  . CATARACT EXTRACTION Right   . COLONOSCOPY  12/25/2013   repeat every 5  years per [facility]; 09/15/2011, 05/18/2008  . COLONOSCOPY WITH PROPOFOL N/A 11/13/2019   Procedure: COLONOSCOPY WITH PROPOFOL;  Surgeon: Toledo, Boykin Nearing, MD;  Location: [facility];  Service: Gastroenterology;  Laterality: N/A;  . ESOPHAGOGASTRODUODENOSCOPY    . ESOPHAGOGASTRODUODENOSCOPY (EGD) WITH PROPOFOL N/A 11/13/2019   Procedure: ESOPHAGOGASTRODUODENOSCOPY (EGD) WITH PROPOFOL;  Surgeon: Toledo, Boykin Nearing, MD;  Location: [facility] ENDOSCOPY;  Service: Gastroenterology;  Laterality: N/A;  . TONSILLECTOMY  1960   Social History   Socioeconomic History  . Marital status: Divorced    Spouse name: Not on file  . Number of children: Not on file  . Years of education: Not on file  . Highest education level: Not on file  Occupational History  . Not on file  Tobacco Use  . Smoking status: Former Smoker    Packs/day: 1.00    Years: 32.00    Pack years: 32.00    Types: Cigarettes    Quit date: 2011    Years since quitting: 10.9  . Smokeless tobacco: Never Used  . Tobacco comment: QUIT IN 2010  Vaping Use  . Vaping Use: Never used  Substance and Sexual Activity  . Alcohol use: Yes    Alcohol/week: 7.0 standard drinks    Types: 7 Glasses of wine per week  . Drug use: No  . Sexual activity: Not on file  Other Topics Concern  . Not on file  Social History Narrative  . Not on file   Social Determinants of Health   Financial Resource Strain: Not on file  Food Insecurity: Not on file  Transportation Needs: Not on file  Physical Activity: Not on file  Stress: Not on file  Social Connections: Not on file  Intimate Partner Violence: Not on file   Family Status  Relation Name Status  . Mother  Alive  . Father  Deceased  . Daughter  Alive  . Sister 1 Alive  . Brother 1 Alive  . Brother 2 Alive  . Brother 3 Alive  . Brother 4 Alive  . Sister 2 Alive  . Sister 3 Alive  . Neg Hx  (Not Specified)   Family History  Problem Relation Age of Onset  . Hypertension Mother   . Diabetes  Mother   . Transient ischemic attack Mother   . Congestive Heart Failure Mother   . Dementia Mother   . CVA Father   . Emphysema Father   . Alcohol abuse Father   . Drug abuse Father   . Hypertension Father   . Hypertension Sister   . Hypertension Brother   . Seizures Brother   . Hypertension Brother   . Hypertension Sister   . Breast cancer Neg Hx    No Known Allergies  Patient Care Team: Maryella Shivers as PCP - General (Physician Assistant)   Medications: Outpatient Medications Prior to Visit  Medication Sig  .  alendronate (FOSAMAX) 70 MG tablet TAKE 1 TABLET (70 MG TOTAL) BY MOUTH EVERY 7 (SEVEN) DAYS. TAKE WITH A FULL GLASS OF WATER ON AN EMPTY STOMACH.  Marland Kitchen amLODipine (NORVASC) 5 MG tablet TAKE 1 TABLET BY MOUTH EVERY DAY  . atorvastatin (LIPITOR) 10 MG tablet TAKE 1 TABLET BY MOUTH EVERY DAY  . azelastine (OPTIVAR) 0.05 % ophthalmic solution INSTILL 1 DROP INTO BOTH EYES TWICE A DAY  . Biotin 5000 MCG CAPS 1 capsule daily.  . Biotin 5000 MCG CAPS Take by mouth.  . Calcium Carbonate-Vit D-Min (CALCIUM 1200 PO) 1 tablet daily.  . carvedilol (COREG) 12.5 MG tablet Take 12.5 mg by mouth 2 (two) times daily with a meal.  . COMBIGAN 0.2-0.5 % ophthalmic solution 1 DROP IN BOTH EYES TWICE A DAY  . folic acid (FOLVITE) 800 MCG tablet 1 tablet daily.  Marland Kitchen latanoprost (XALATAN) 0.005 % ophthalmic solution   . methotrexate 2.5 MG tablet 7 tablets once a week.   . Omega-3 Fatty Acids (FISH OIL BURP-LESS) 1000 MG CAPS Take 1 capsule by mouth daily.   Marland Kitchen triamterene-hydrochlorothiazide (MAXZIDE-25) 37.5-25 MG tablet TAKE 1 TABLET BY MOUTH EVERY DAY  . valACYclovir (VALTREX) 1000 MG tablet TAKE 1 TABLET BY MOUTH EVERY DAY  . Aspirin 81 MG EC tablet 1 tablet daily. (Patient not taking: Reported on 02/26/2020)   No facility-administered medications prior to visit.    Review of Systems  Constitutional: Negative.   HENT: Negative.   Eyes: Negative.   Respiratory: Negative.    Cardiovascular: Negative.   Gastrointestinal: Negative.   Endocrine: Negative.   Genitourinary: Negative.   Musculoskeletal: Negative.   Skin: Negative.   Allergic/Immunologic: Negative.   Neurological: Negative.   Hematological: Negative.   Psychiatric/Behavioral: Negative.       Objective    BP 120/65   Pulse 68   Temp 98.1 F (36.7 C)   Resp 16   Ht  (1.626 m)   Wt 151 lb 4.8 oz (68.6 kg)   BMI 25.97 kg/m    Physical Exam Constitutional:      Appearance: Normal appearance.  HENT:     Right Ear: Tympanic membrane and ear canal normal.     Left Ear: Tympanic membrane and ear canal normal.  Cardiovascular:     Rate and Rhythm: Normal rate and regular rhythm.     Heart sounds: Normal heart sounds.  Pulmonary:     Effort: Pulmonary effort is normal.     Breath sounds: Normal breath sounds.  Abdominal:     General: Bowel sounds are normal.     Palpations: Abdomen is soft.  Musculoskeletal:     Cervical back: Normal range of motion and neck supple.  Lymphadenopathy:     Cervical: No cervical adenopathy.  Skin:    General: Skin is warm and dry.  Neurological:     Mental Status: She is alert and oriented to person, place, and time. Mental status is at baseline.  Psychiatric:        Mood and Affect: Mood normal.        Behavior: Behavior normal.       Last depression screening scores PHQ 2/9 Scores 02/26/2020 02/25/2019 02/02/2017  PHQ - 2 Score 1 1 0  PHQ- 9 Score Last fall risk screening Fall Risk  02/26/2020  Falls in the past year? 0  Number falls in past yr: 0  Injury with Fall? 0  Risk for fall due to :  No Fall Risks  Follow up Falls evaluation completed   Last Audit-C alcohol use screening Alcohol Use Disorder Test (AUDIT) 02/26/2020  1. How often do you have a drink containing alcohol? 3  2. How many drinks containing alcohol do you have on a typical day when you are drinking? 0  3. How often do you have six or more drinks on one  occasion? 0  AUDIT-C Score 3  4. How often during the last year have you found that you were not able to stop drinking once you had started? 0  5. How often during the last year have you failed to do what was normally expected from you because of drinking? 0  6. How often during the last year have you needed a first drink in the morning to get yourself going after a heavy drinking session? 0  7. How often during the last year have you had a feeling of guilt of remorse after drinking? 0  8. How often during the last year have you been unable to remember what happened the night before because you had been drinking? 0  9. Have you or someone else been injured as a result of your drinking? 0  10. Has a relative or friend or a doctor or another health worker been concerned about your drinking or suggested you cut down? 0  Alcohol Use Disorder Identification Test Final Score (AUDIT) 3  Alcohol Brief Interventions/Follow-up AUDIT Score <7 follow-up not indicated   A score of 3 or more in women, and 4 or more in men indicates increased risk for alcohol abuse, EXCEPT if all of the points are from question 1   No results found for any visits on 02/26/20.  Assessment & Plan    Routine Health Maintenance and Physical Exam  Exercise Activities and Dietary recommendations Goals    . Exercise 150 minutes per week (moderate activity)       Immunization History  Administered Date(s) Administered  . Influenza Inj Mdck Quad Pf 01/08/2017  . Influenza Split 02/01/2011  . Influenza, High Dose Seasonal PF 01/22/2018, 12/20/2018  . Influenza,inj,Quad PF,6+ Mos 12/21/2014  . Influenza-Unspecified 12/20/2018, 12/28/2019  . PFIZER SARS-COV-2 Vaccination 04/11/2019, 05/02/2019, 01/14/2020  . Pneumococcal Conjugate-13 08/22/2017  . Pneumococcal Polysaccharide-23 02/25/2019  . Tdap 10/18/2009  . Zoster 04/24/2013    Health Maintenance  Topic Date Due  . TETANUS/TDAP  02/25/2021 (Originally 10/19/2019)  .  MAMMOGRAM  11/03/2021  . COLONOSCOPY  11/12/2024  . INFLUENZA VACCINE  Completed  . DEXA SCAN  Completed  . COVID-19 Vaccine  Completed  . Hepatitis C Screening  Completed  . PNA vac Low Risk Adult  Completed    Discussed health benefits of physical activity, and encouraged her to engage in regular exercise appropriate for her age and condition.  1. Annual physical exam  She will call her insurance company about the shingles vaccine.  - TSH - Lipid panel - Comprehensive metabolic panel - CBC with Differential/Platelet  2. Essential (primary) hypertension  Continue amlodipine.   - TSH - Lipid panel - Comprehensive metabolic panel - CBC with Differential/Platelet  3. Hypercholesteremia  Continue statin.   - TSH - Lipid panel - Comprehensive metabolic panel - CBC with Differential/Platelet  4. Barrett's esophagus with dysplasia  Start omeprazole 40 mg QD.   5. Osteoporosis, unspecified osteoporosis type, unspecified pathological fracture presence  Check DEXA again. If T score same or worse, consider referral to endocrinology for Prolia.   - DG Bone Density;  Future   No follow-ups on file.     ITrey Sailors, PA-C, have reviewed all documentation for this visit. The documentation on 02/26/20 for the exam, diagnosis, procedures, and orders are all accurate and complete.  The entirety of the information documented in the History of Present Illness, Review of Systems and Physical Exam were personally obtained by me. Portions of this information were initially documented by Anson [place], CMA and reviewed by me for thoroughness and accuracy.      Maryella Shivers  [facility] [phone] (phone) [phone] (fax)  [facility]

## 2020-02-27 ENCOUNTER — Telehealth: Payer: Self-pay

## 2020-02-27 ENCOUNTER — Other Ambulatory Visit: Payer: Self-pay | Admitting: Physician Assistant

## 2020-02-27 ENCOUNTER — Telehealth: Payer: Self-pay | Admitting: Physician Assistant

## 2020-02-27 DIAGNOSIS — M810 Age-related osteoporosis without current pathological fracture: Secondary | ICD-10-CM

## 2020-02-27 LAB — COMPREHENSIVE METABOLIC PANEL
ALT: 15 IU/L (ref 0–32)
AST: 20 IU/L (ref 0–40)
Albumin/Globulin Ratio: 1.7 (ref 1.2–2.2)
Albumin: 4.5 g/dL (ref 3.8–4.8)
Alkaline Phosphatase: 49 IU/L (ref 44–121)
BUN/Creatinine Ratio: 22 (ref 12–28)
BUN: 15 mg/dL (ref 8–27)
Bilirubin Total: 0.6 mg/dL (ref 0.0–1.2)
CO2: 24 mmol/L (ref 20–29)
Calcium: 8.9 mg/dL (ref 8.7–10.3)
Chloride: 98 mmol/L (ref 96–106)
Creatinine, Ser: 0.68 mg/dL (ref 0.57–1.00)
GFR calc Af Amer: 105 mL/min/{1.73_m2} (ref >59)
GFR calc non Af Amer: 91 mL/min/{1.73_m2} (ref >59)
Globulin, Total: 2.6 g/dL (ref 1.5–4.5)
Glucose: 112 mg/dL — ABNORMAL HIGH (ref 65–99)
Potassium: 3.4 mmol/L — ABNORMAL LOW (ref 3.5–5.2)
Sodium: 137 mmol/L (ref 134–144)
Total Protein: 7.1 g/dL (ref 6.0–8.5)

## 2020-02-27 LAB — TSH: TSH: 0.752 u[IU]/mL (ref 0.450–4.500)

## 2020-02-27 LAB — CBC WITH DIFFERENTIAL/PLATELET
Basophils Absolute: 0.1 10*3/uL (ref 0.0–0.2)
Basos: 1 %
EOS (ABSOLUTE): 0.1 10*3/uL (ref 0.0–0.4)
Eos: 2 %
Hematocrit: 35.9 % (ref 34.0–46.6)
Hemoglobin: 12.9 g/dL (ref 11.1–15.9)
Immature Grans (Abs): 0 10*3/uL (ref 0.0–0.1)
Immature Granulocytes: 0 %
Lymphocytes Absolute: 1.7 10*3/uL (ref 0.7–3.1)
Lymphs: 29 %
MCH: 36.2 pg — ABNORMAL HIGH (ref 26.6–33.0)
MCHC: 35.9 g/dL — ABNORMAL HIGH (ref 31.5–35.7)
MCV: 101 fL — ABNORMAL HIGH (ref 79–97)
Monocytes Absolute: 0.7 10*3/uL (ref 0.1–0.9)
Monocytes: 12 %
Neutrophils Absolute: 3.3 10*3/uL (ref 1.4–7.0)
Neutrophils: 56 %
Platelets: 191 10*3/uL (ref 150–450)
RBC: 3.56 x10E6/uL — ABNORMAL LOW (ref 3.77–5.28)
RDW: 11.9 % (ref 11.7–15.4)
WBC: 5.9 10*3/uL (ref 3.4–10.8)

## 2020-02-27 LAB — LIPID PANEL
Chol/HDL Ratio: 2.1 ratio (ref 0.0–4.4)
Cholesterol, Total: 201 mg/dL — ABNORMAL HIGH (ref 100–199)
HDL: 94 mg/dL (ref >39)
LDL Chol Calc (NIH): 87 mg/dL (ref 0–99)
Triglycerides: 116 mg/dL (ref 0–149)
VLDL Cholesterol Cal: 20 mg/dL (ref 5–40)

## 2020-02-27 NOTE — Telephone Encounter (Signed)
Patient returned call to office and she was read lab message by [name].PA. today 02/27/20 She verbalized understanding and will wait for A1C result.

## 2020-02-27 NOTE — Telephone Encounter (Signed)
-----   Message from Trey Sailors, [place] sent at 02/27/2020  9:06 AM EST ----- Can we add A1c on under hyperglycemia? Thank you!   Hi Damiana,   Your labs are pretty stable. Your sugar is just a bit elevated so I am going to add a follow up test to check for any diabetes. We'll let you know the results when they return.   Best, [name], PA-C

## 2020-02-27 NOTE — Telephone Encounter (Signed)
Called patient and no answer, LVMTRC. If patient calls back okay for [facility] to advise. 

## 2020-03-01 NOTE — Telephone Encounter (Signed)
Patient returned call and was advised via another message from the [facility].

## 2020-03-02 LAB — HEMOGLOBIN A1C
Est. average glucose Bld gHb Est-mCnc: 103 mg/dL
Hgb A1c MFr Bld: 5.2 % (ref 4.8–5.6)

## 2020-03-02 LAB — SPECIMEN STATUS REPORT

## 2020-03-04 ENCOUNTER — Other Ambulatory Visit: Payer: Self-pay | Admitting: Physician Assistant

## 2020-03-04 DIAGNOSIS — I10 Essential (primary) hypertension: Secondary | ICD-10-CM

## 2020-03-04 DIAGNOSIS — I7300 Raynaud's syndrome without gangrene: Secondary | ICD-10-CM

## 2020-04-08 NOTE — Progress Notes (Signed)
Subjective:   Courtney Rodgers is a 70 y.o. female who presents for an Initial Medicare Annual Wellness Visit.  I connected with Courtney Rodgers today by telephone and verified that I am speaking with the correct person using two identifiers. Location patient: home Location provider: work Persons participating in the virtual visit: patient, provider.   I discussed the limitations, risks, security and privacy concerns of performing an evaluation and management service by telephone and the availability of in person appointments. I also discussed with the patient that there may be a patient responsible charge related to this service. The patient expressed understanding and verbally consented to this telephonic visit.    Interactive audio and video telecommunications were attempted between this provider and patient, however failed, due to patient having technical difficulties OR patient did not have access to video capability.  We continued and completed visit with audio only.   Review of Systems    N/A  Cardiac Risk Factors include: advanced age (>66men, >46 women);dyslipidemia;hypertension;sedentary lifestyle     Objective:    There were no vitals filed for this visit. There is no height or weight on file to calculate BMI.  Advanced Directives 04/12/2020 11/13/2019  Does Patient Have a Medical Advance Directive? No No  Would patient like information on creating a medical advance directive? No - Patient declined No - Patient declined    Current Medications (verified) Outpatient Encounter Medications as of 04/12/2020  Medication Sig  . alendronate (FOSAMAX) 70 MG tablet TAKE 1 TABLET (70 MG TOTAL) BY MOUTH EVERY 7 (SEVEN) DAYS. TAKE WITH A FULL GLASS OF WATER ON AN EMPTY STOMACH.  Marland Kitchen amLODipine (NORVASC) 5 MG tablet TAKE 1 TABLET BY MOUTH EVERY DAY  . atorvastatin (LIPITOR) 10 MG tablet TAKE 1 TABLET BY MOUTH EVERY DAY  . Biotin 5000 MCG CAPS 1 capsule daily.  . carvedilol (COREG)  12.5 MG tablet Take 12.5 mg by mouth 2 (two) times daily with a meal.  . COMBIGAN 0.2-0.5 % ophthalmic solution 1 DROP IN BOTH EYES TWICE A DAY  . folic acid (FOLVITE) 800 MCG tablet 1 tablet daily.  Marland Kitchen latanoprost (XALATAN) 0.005 % ophthalmic solution Place 1 drop into both eyes at bedtime.  . methotrexate 2.5 MG tablet 6 tablets once a week.  . Multiple Vitamin (MULTI-VITAMIN) tablet Take 1 tablet by mouth daily.  . Omega-3 Fatty Acids (FISH OIL BURP-LESS) 1000 MG CAPS Take 1 capsule by mouth daily.   Marland Kitchen triamterene-hydrochlorothiazide (MAXZIDE-25) 37.5-25 MG tablet TAKE 1 TABLET BY MOUTH EVERY DAY  . valACYclovir (VALTREX) 1000 MG tablet TAKE 1 TABLET BY MOUTH EVERY DAY  . Aspirin 81 MG EC tablet 1 tablet daily. (Patient not taking: No sig reported)  . azelastine (OPTIVAR) 0.05 % ophthalmic solution INSTILL 1 DROP INTO BOTH EYES TWICE A DAY (Patient not taking: Reported on 04/12/2020)  . Biotin 5000 MCG CAPS Take by mouth. (Patient not taking: Reported on 04/12/2020)  . Calcium Carbonate-Vit D-Min (CALCIUM 1200 PO) 1 tablet daily. (Patient not taking: Reported on 04/12/2020)   No facility-administered encounter medications on file as of 04/12/2020.    Allergies (verified) Patient has no known allergies.   History: Past Medical History:  Diagnosis Date  . Acid reflux   . Allergy   . Diverticulosis 12/25/2013  . Hyperlipidemia   . Hypertension   . Raynaud's disease   . Rheumatoid arthritis (HCC) 01/28/2014   [facility]   Past Surgical History:  Procedure Laterality Date  . APPENDECTOMY  1980  .  CATARACT EXTRACTION Right   . COLONOSCOPY  12/25/2013   repeat every 5 years per [facility]; 09/15/2011, 05/18/2008  . COLONOSCOPY WITH PROPOFOL N/A 11/13/2019   Procedure: COLONOSCOPY WITH PROPOFOL;  Surgeon: Toledo, Boykin Nearing, MD;  Location: [facility] END[facility]COPY;  Service: Gastroenterology;  Laterality: N/A;  . ESOPHAGOGASTRODUODENOSCOPY    . ESOPHAGOGASTRODUODENOSCOPY (EGD) WITH PROPOFOL N/A 11/13/2019    Procedure: ESOPHAGOGASTRODUODENOSCOPY (EGD) WITH PROPOFOL;  Surgeon: Toledo, Boykin Nearing, MD;  Location: [facility] END[facility]COPY;  Service: Gastroenterology;  Laterality: N/A;  . TONSILLECTOMY  1960   Family History  Problem Relation Age of Onset  . Hypertension Mother   . Diabetes Mother   . Transient ischemic attack Mother   . Congestive Heart Failure Mother   . Dementia Mother   . CVA Father   . Emphysema Father   . Alcohol abuse Father   . Drug abuse Father   . Hypertension Father   . Hypertension Sister   . Hypertension Brother   . Seizures Brother   . Hypertension Brother   . Hypertension Sister   . Breast cancer Neg Hx    Social History   Socioeconomic History  . Marital status: Divorced    Spouse name: Not on file  . Number of children: 1  . Years of education: Not on file  . Highest education level: Bachelor's degree (e.g., BA, AB, BS)  Occupational History  . Occupation: retired  . Occupation: part time subsitute  Tobacco Use  . Smoking status: Former Smoker    Packs/day: 1.00    Years: 32.00    Pack years: 32.00    Types: Cigarettes    Quit date: 2011    Years since quitting: 11.0  . Smokeless tobacco: Never Used  . Tobacco comment: QUIT IN 2010  Vaping Use  . Vaping Use: Never used  Substance and Sexual Activity  . Alcohol use: Yes    Alcohol/week: 7.0 standard drinks    Types: 7 Glasses of wine per week    Comment: 1 glass a night  . Drug use: No  . Sexual activity: Not on file  Other Topics Concern  . Not on file  Social History Narrative  . Not on file   Social Determinants of Health   Financial Resource Strain: Low Risk   . Difficulty of Paying Living Expenses: Not hard at all  Food Insecurity: No Food Insecurity  . Worried About Programme researcher, broadcasting/film/video in the Last Year: Never true  . Ran Out of Food in the Last Year: Never true  Transportation Needs: No Transportation Needs  . Lack of Transportation (Medical): No  . Lack of Transportation  (Non-Medical): No  Physical Activity: Sufficiently Active  . Days of Exercise per Week: 5 days  . Minutes of Exercise per Session: 80 min  Stress: No Stress Concern Present  . Feeling of Stress : Not at all  Social Connections: Moderately Integrated  . Frequency of Communication with Friends and Family: More than three times a week  . Frequency of Social Gatherings with Friends and Family: Once a week  . Attends Religious Services: More than 4 times per year  . Active Member of Clubs or Organizations: Yes  . Attends Banker Meetings: More than 4 times per year  . Marital Status: Divorced    Tobacco Counseling Counseling given: Not Answered Comment: QUIT IN 2010   Clinical Intake:  Pre-visit preparation completed: Yes  Pain : No/denies pain     Nutritional Risks: None Diabetes: No  How often do you need to have someone help you when you read instructions, pamphlets, or other written materials from your doctor or pharmacy?: 1 - Never  Diabetic? No  Interpreter Needed?: No  Information entered by :: [facility], LPN   Activities of Daily Living In your present state of health, do you have any difficulty performing the following activities: 04/12/2020 02/26/2020  Hearing? N N  Vision? N Y  Difficulty concentrating or making decisions? N Y  Walking or climbing stairs? N N  Dressing or bathing? N N  Doing errands, shopping? N N  Preparing Food and eating ? N -  Using the Toilet? N -  In the past six months, have you accidently leaked urine? N -  Do you have problems with loss of bowel control? N -  Managing your Medications? N -  Managing your Finances? N -  Housekeeping or managing your Housekeeping? N -  Some recent data might be hidden    Patient Care Team: Maryella Shivers as PCP - General (Physician Assistant) Thurnell Garbe, OD (Optometry) Kandyce Rud., MD (Rheumatology)  Indicate any recent Medical Services you may have  received from other than [facility] providers in the past year (date may be approximate).     Assessment:   This is a routine wellness examination for Lakaisha.  Hearing/Vision screen No exam data present  Dietary issues and exercise activities discussed: Current Exercise Habits: Home exercise routine, Type of exercise: walking, Time (Minutes): > 60, Frequency (Times/Week): 5, Weekly Exercise (Minutes/Week): 0, Intensity: Mild, Exercise limited by: None identified  Goals    . DIET - EAT MORE FRUITS AND VEGETABLES     Recommend to start eating 2 servings of fruit every day.       Depression Screen PHQ 2/9 Scores 04/12/2020 02/26/2020 02/25/2019 02/02/2017 12/21/2014  PHQ - 2 Score 0 1 1 0 0  PHQ- 9 Score - 5 5 2  -    Fall Risk Fall Risk  04/12/2020 02/26/2020 02/25/2019 02/21/2018 02/21/2018  Falls in the past year? 0 0 0 1 1  Number falls in past yr: 0 0 0 0 0  Injury with Fall? 0 0 0 0 1  Risk for fall due to : - No Fall Risks - - -  Follow up - Falls evaluation completed - - -    FALL RISK PREVENTION PERTAINING TO THE HOME:  Any stairs in or around the home? Yes  If so, are there any without handrails? No  Home free of loose throw rugs in walkways, pet beds, electrical cords, etc? Yes  Adequate lighting in your home to reduce risk of falls? Yes   ASSISTIVE DEVICES UTILIZED TO PREVENT FALLS:  Life alert? No  Use of a cane, walker or w/c? No  Grab bars in the bathroom? No  Shower chair or bench in shower? No  Elevated toilet seat or a handicapped toilet? Yes    Cognitive Function: Normal cognitive status assessed by observation by this Nurse Health Advisor. No abnormalities found.       6CIT Screen 02/21/2018  What Year? 0 points  What month? 0 points  What time? 0 points  Count back from 20 0 points  Months in reverse 0 points  Repeat phrase 2 points  Total Score 2    Immunizations Immunization History  Administered Date(s) Administered  . Influenza Inj Mdck Quad Pf  01/08/2017  . Influenza Split 02/01/2011  . Influenza, High Dose Seasonal PF 01/22/2018, 12/20/2018  .  Influenza,inj,Quad PF,6+ Mos 12/21/2014  . Influenza-Unspecified 12/20/2018, 12/28/2019  . PFIZER(Purple Top)SARS-COV-2 Vaccination 04/11/2019, 05/02/2019, 01/14/2020  . Pneumococcal Conjugate-13 08/22/2017  . Pneumococcal Polysaccharide-23 02/25/2019  . Tdap 10/18/2009  . Zoster 04/24/2013  . Zoster Recombinat (Shingrix) 04/08/2020    TDAP status: Due, Education has been provided regarding the importance of this vaccine. Advised may receive this vaccine at local pharmacy or Health Dept. Aware to provide a copy of the vaccination record if obtained from local pharmacy or Health Dept. Verbalized acceptance and understanding.  Flu Vaccine status: Up to date  Pneumococcal vaccine status: Up to date  Covid-19 vaccine status: Completed vaccines  Qualifies for Shingles Vaccine? Yes   Zostavax completed Yes   Shingrix Completed?: No.    Education has been provided regarding the importance of this vaccine. Patient has been advised to call insurance company to determine out of pocket expense if they have not yet received this vaccine. Advised may also receive vaccine at local pharmacy or Health Dept. Verbalized acceptance and understanding.  Screening Tests Health Maintenance  Topic Date Due  . TETANUS/TDAP  02/25/2021 (Originally 10/19/2019)  . DEXA SCAN  04/16/2020  . MAMMOGRAM  11/03/2021  . COLONOSCOPY (Pts 45-10yrs Insurance coverage will need to be confirmed)  11/12/2024  . INFLUENZA VACCINE  Completed  . COVID-19 Vaccine  Completed  . Hepatitis C Screening  Completed  . PNA vac Low Risk Adult  Completed    Health Maintenance  There are no preventive care reminders to display for this patient.  Colorectal cancer screening: Type of screening: Colonoscopy. Completed 11/13/19. Repeat every 5 years  Mammogram status: Completed 11/04/19. Repeat every year  Bone Density status:  Completed 04/16/18. Results reflect: Bone density results: OSTEOPOROSIS. Repeat every 2 years.  Lung Cancer Screening: (Low Dose CT Chest recommended if Age 63-80 years, 30 pack-year currently smoking OR have quit w/in 15years.) does qualify, however declines order today.    Additional Screening:  Hepatitis C Screening: Up to date  Vision Screening: Recommended annual ophthalmology exams for early detection of glaucoma and other disorders of the eye. Is the patient up to date with their annual eye exam?  Yes  Who is the provider or what is the name of the office in which the patient attends annual eye exams? Dr Gayla Doss If pt is not established with a provider, would they like to be referred to a provider to establish care? No .   Dental Screening: Recommended annual dental exams for proper oral hygiene  Community Resource Referral / Chronic Care Management: CRR required this visit?  No   CCM required this visit?  No      Plan:     I have personally reviewed and noted the following in the patient's chart:   . Medical and social history . Use of alcohol, tobacco or illicit drugs  . Current medications and supplements . Functional ability and status . Nutritional status . Physical activity . Advanced directives . List of other physicians . Hospitalizations, surgeries, and ER visits in previous 12 months . Vitals . Screenings to include cognitive, depression, and falls . Referrals and appointments  In addition, I have reviewed and discussed with patient certain preventive protocols, quality metrics, and best practice recommendations. A written personalized care plan for preventive services as well as general preventive health recommendations were provided to patient.     Arno Cullers [place], [place]   1/61/0960   Nurse Notes: None.

## 2020-04-12 ENCOUNTER — Ambulatory Visit (INDEPENDENT_AMBULATORY_CARE_PROVIDER_SITE_OTHER): Payer: Medicare PPO

## 2020-04-12 ENCOUNTER — Other Ambulatory Visit: Payer: Self-pay

## 2020-04-12 DIAGNOSIS — Z0000 Encounter for general adult medical examination without abnormal findings: Secondary | ICD-10-CM | POA: Diagnosis not present

## 2020-04-12 NOTE — Patient Instructions (Signed)
Courtney Rodgers , Thank you for taking time to come for your Medicare Wellness Visit. I appreciate your ongoing commitment to your health goals. Please review the following plan we discussed and let me know if I can assist you in the future.   Screening recommendations/referrals: Colonoscopy: Up to date, due 10/2024 Mammogram: Up to date, due 10/2020 Bone Density: Up to date, scheduled for 04/19/20 Recommended yearly ophthalmology/optometry visit for glaucoma screening and checkup Recommended yearly dental visit for hygiene and checkup  Vaccinations: Influenza vaccine: Done 12/28/19 Pneumococcal vaccine: Completed series Tdap vaccine: Currently due, delcined receiving.  Shingles vaccine: Up to date, Shingrix #2 due 06/06/20    Advanced directives: Advance directive discussed with you today. Even though you declined this today please call our office should you change your mind and we can give you the proper paperwork for you to fill out.  Conditions/risks identified: Recommend to start eating 2 servings of fruit every day.   Next appointment: 03/01/21 @ 11:00 AM with Courtney Rodgers    Preventive Care 65 Years and Older, Female Preventive care refers to lifestyle choices and visits with your health care provider that can promote health and wellness. What does preventive care include?  A yearly physical exam. This is also called an annual well check.  Dental exams once or twice a year.  Routine eye exams. Ask your health care provider how often you should have your eyes checked.  Personal lifestyle choices, including:  Daily care of your teeth and gums.  Regular physical activity.  Eating a healthy diet.  Avoiding tobacco and drug use.  Limiting alcohol use.  Practicing safe sex.  Taking low-dose aspirin every day.  Taking vitamin and mineral supplements as recommended by your health care provider. What happens during an annual well check? The services and screenings done by  your health care provider during your annual well check will depend on your age, overall health, lifestyle risk factors, and family history of disease. Counseling  Your health care provider may ask you questions about your:  Alcohol use.  Tobacco use.  Drug use.  Emotional well-being.  Home and relationship well-being.  Sexual activity.  Eating habits.  History of falls.  Memory and ability to understand (cognition).  Work and work Astronomer.  Reproductive health. Screening  You may have the following tests or measurements:  Height, weight, and BMI.  Blood pressure.  Lipid and cholesterol levels. These may be checked every 5 years, or more frequently if you are over 39 years old.  Skin check.  Lung cancer screening. You may have this screening every year starting at age 82 if you have a 30-pack-year history of smoking and currently smoke or have quit within the past 15 years.  Fecal occult blood test (FOBT) of the stool. You may have this test every year starting at age 18.  Flexible sigmoidoscopy or colonoscopy. You may have a sigmoidoscopy every 5 years or a colonoscopy every 10 years starting at age 35.  Hepatitis C blood test.  Hepatitis B blood test.  Sexually transmitted disease (STD) testing.  Diabetes screening. This is done by checking your blood sugar (glucose) after you have not eaten for a while (fasting). You may have this done every 1-3 years.  Bone density scan. This is done to screen for osteoporosis. You may have this done starting at age 57.  Mammogram. This may be done every 1-2 years. Talk to your health care provider about how often you should have regular mammograms.  Talk with your health care provider about your test results, treatment options, and if necessary, the need for more tests. Vaccines  Your health care provider may recommend certain vaccines, such as:  Influenza vaccine. This is recommended every year.  Tetanus,  diphtheria, and acellular pertussis (Tdap, Td) vaccine. You may need a Td booster every 10 years.  Zoster vaccine. You may need this after age 66.  Pneumococcal 13-valent conjugate (PCV13) vaccine. One dose is recommended after age 35.  Pneumococcal polysaccharide (PPSV23) vaccine. One dose is recommended after age 7. Talk to your health care provider about which screenings and vaccines you need and how often you need them. This information is not intended to replace advice given to you by your health care provider. Make sure you discuss any questions you have with your health care provider. Document Released: 04/02/2015 Document Revised: 11/24/2015 Document Reviewed: 01/05/2015 Elsevier Interactive Patient Education  2017 [place] Prevention in the Home Falls can cause injuries. They can happen to people of all ages. There are many things you can do to make your home safe and to help prevent falls. What can I do on the outside of my home?  Regularly fix the edges of walkways and driveways and fix any cracks.  Remove anything that might make you trip as you walk through a door, such as a raised step or threshold.  Trim any bushes or trees on the path to your home.  Use bright outdoor lighting.  Clear any walking paths of anything that might make someone trip, such as rocks or tools.  Regularly check to see if handrails are loose or broken. Make sure that both sides of any steps have handrails.  Any raised decks and porches should have guardrails on the edges.  Have any leaves, snow, or ice cleared regularly.  Use sand or salt on walking paths during winter.  Clean up any spills in your garage right away. This includes oil or grease spills. What can I do in the bathroom?  Use night lights.  Install grab bars by the toilet and in the tub and shower. Do not use towel bars as grab bars.  Use non-skid mats or decals in the tub or shower.  If you need to sit down in  the shower, use a plastic, non-slip stool.  Keep the floor dry. Clean up any water that spills on the floor as soon as it happens.  Remove soap buildup in the tub or shower regularly.  Attach bath mats securely with double-sided non-slip rug tape.  Do not have throw rugs and other things on the floor that can make you trip. What can I do in the bedroom?  Use night lights.  Make sure that you have a light by your bed that is easy to reach.  Do not use any sheets or blankets that are too big for your bed. They should not hang down onto the floor.  Have a firm chair that has side arms. You can use this for support while you get dressed.  Do not have throw rugs and other things on the floor that can make you trip. What can I do in the kitchen?  Clean up any spills right away.  Avoid walking on wet floors.  Keep items that you use a lot in easy-to-reach places.  If you need to reach something above you, use a strong step stool that has a grab bar.  Keep electrical cords out of the way.  Do not use floor polish or wax that makes floors slippery. If you must use wax, use non-skid floor wax.  Do not have throw rugs and other things on the floor that can make you trip. What can I do with my stairs?  Do not leave any items on the stairs.  Make sure that there are handrails on both sides of the stairs and use them. Fix handrails that are broken or loose. Make sure that handrails are as long as the stairways.  Check any carpeting to make sure that it is firmly attached to the stairs. Fix any carpet that is loose or worn.  Avoid having throw rugs at the top or bottom of the stairs. If you do have throw rugs, attach them to the floor with carpet tape.  Make sure that you have a light switch at the top of the stairs and the bottom of the stairs. If you do not have them, ask someone to add them for you. What else can I do to help prevent falls?  Wear shoes that:  Do not have high  heels.  Have rubber bottoms.  Are comfortable and fit you well.  Are closed at the toe. Do not wear sandals.  If you use a stepladder:  Make sure that it is fully opened. Do not climb a closed stepladder.  Make sure that both sides of the stepladder are locked into place.  Ask someone to hold it for you, if possible.  Clearly mark and make sure that you can see:  Any grab bars or handrails.  First and last steps.  Where the edge of each step is.  Use tools that help you move around (mobility aids) if they are needed. These include:  Canes.  Walkers.  Scooters.  Crutches.  Turn on the lights when you go into a dark area. Replace any light bulbs as soon as they burn out.  Set up your furniture so you have a clear path. Avoid moving your furniture around.  If any of your floors are uneven, fix them.  If there are any pets around you, be aware of where they are.  Review your medicines with your doctor. Some medicines can make you feel dizzy. This can increase your chance of falling. Ask your doctor what other things that you can do to help prevent falls. This information is not intended to replace advice given to you by your health care provider. Make sure you discuss any questions you have with your health care provider. Document Released: 12/31/2008 Document Revised: 08/12/2015 Document Reviewed: 04/10/2014 Elsevier Interactive Patient Education  2017 [employer].

## 2020-04-19 ENCOUNTER — Ambulatory Visit
Admission: RE | Admit: 2020-04-19 | Discharge: 2020-04-19 | Disposition: A | Discharge status: Home / Self Care | Payer: Medicare PPO | Source: Ambulatory Visit | Attending: Physician Assistant | Admitting: Physician Assistant

## 2020-04-19 ENCOUNTER — Other Ambulatory Visit: Payer: Self-pay | Admitting: Physician Assistant

## 2020-04-19 ENCOUNTER — Other Ambulatory Visit: Payer: Self-pay

## 2020-04-19 DIAGNOSIS — M810 Age-related osteoporosis without current pathological fracture: Secondary | ICD-10-CM | POA: Insufficient documentation

## 2020-04-19 DIAGNOSIS — I10 Essential (primary) hypertension: Secondary | ICD-10-CM

## 2020-05-19 ENCOUNTER — Other Ambulatory Visit: Payer: Self-pay | Admitting: Physician Assistant

## 2020-05-19 DIAGNOSIS — B009 Herpesviral infection, unspecified: Secondary | ICD-10-CM

## 2020-08-03 ENCOUNTER — Telehealth: Payer: Self-pay | Admitting: Family Medicine

## 2020-08-03 DIAGNOSIS — I10 Essential (primary) hypertension: Secondary | ICD-10-CM

## 2020-08-03 MED ORDER — TRIAMTERENE-HCTZ 37.5-25 MG PO TABS: 1.0000 | ORAL_TABLET | Freq: Every day | ORAL | 0 refills | Status: DC

## 2020-08-03 NOTE — Telephone Encounter (Signed)
Medication Refill - Medication: triamterene-hydrochlorothiazide (MAXZIDE-25) 37.5-25 MG tablet    Has the patient contacted their pharmacy? Yes.   (Agent: If no, request that the patient contact the pharmacy for the refill.) (Agent: If yes, when and what did the pharmacy advise?)fax was sent to office / no response   Preferred Pharmacy (with phone number or street name): [id number] - [place], [place] - [address]  [address] [place] [address]  Phone:  [phone] Fax:  [phone]   Agent: Please be advised that RX refills may take up to 3 business days. We ask that you follow-up with your pharmacy.

## 2020-09-29 ENCOUNTER — Telehealth: Payer: Self-pay

## 2020-09-29 DIAGNOSIS — Z1231 Encounter for screening mammogram for malignant neoplasm of breast: Secondary | ICD-10-CM

## 2020-09-29 NOTE — Telephone Encounter (Signed)
Is this okay to order?  Thanks,   -Gudelia Eugene  

## 2020-09-29 NOTE — Telephone Encounter (Signed)
Copied from CRM [id number]. Topic: Referral - Request for Referral >> Sep 29, 2020  2:58 PM Marylen Ponto wrote: Has patient seen PCP for this complaint? Yes.   *If NO, is insurance requiring patient see PCP for this issue before PCP can refer them? Referral for which specialty: mammogram Preferred provider/office: [facility] Reason for referral: mammogram

## 2020-10-15 NOTE — Telephone Encounter (Signed)
Pt called office, has questions for the clinic regarding her mammogram order

## 2020-10-15 NOTE — Telephone Encounter (Signed)
Patient was advised order in epic.

## 2020-10-31 ENCOUNTER — Other Ambulatory Visit: Payer: Self-pay | Admitting: Family Medicine

## 2020-10-31 DIAGNOSIS — I10 Essential (primary) hypertension: Secondary | ICD-10-CM

## 2020-10-31 NOTE — Telephone Encounter (Signed)
Requested Prescriptions  Pending Prescriptions Disp Refills  . triamterene-hydrochlorothiazide (MAXZIDE-25) 37.5-25 MG tablet [Pharmacy Med Name: TRIAMTERENE-HCTZ 37.5-25 MG TB] 90 tablet 0    Sig: TAKE 1 TABLET BY MOUTH EVERY DAY     Cardiovascular: Diuretic Combos Failed - 10/31/2020 10:41 AM      Failed - K in normal range and within 360 days    Potassium  Date Value Ref Range Status  02/26/2020 3.4 (L) 3.5 - 5.2 mmol/L Final         Failed - Valid encounter within last 6 months    Recent Outpatient Visits          8 months ago Annual physical exam   [facility] Trey Sailors, PA-C   1 year ago Annual physical exam   [facility] [name] M, [place]   2 years ago Essential (primary) hypertension   [facility] [place], [place], PA-C   2 years ago Essential (primary) hypertension   [employer], [place], PA-C   3 years ago Chest wall contusion, right, initial encounter   [facility] [place], [place], [place]      Future Appointments            In 4 months Bacigalupo, Marzella Schlein, MD [facility]           Passed - Na in normal range and within 360 days    Sodium  Date Value Ref Range Status  02/26/2020 137 134 - 144 mmol/L Final         Passed - Cr in normal range and within 360 days    Creat  Date Value Ref Range Status  02/02/2017 0.88 0.50 - 0.99 mg/dL Final    Comment:    For patients >62 years of age, the reference limit for Creatinine is approximately 13% higher for people identified as African-American. .    Creatinine, Ser  Date Value Ref Range Status  02/26/2020 0.68 0.57 - 1.00 mg/dL Final         Passed - Ca in normal range and within 360 days    Calcium  Date Value Ref Range Status  02/26/2020 8.9 8.7 - 10.3 mg/dL Final         Passed - Last BP in normal range    BP Readings from Last 1 Encounters:  02/26/20 120/65

## 2020-11-08 ENCOUNTER — Ambulatory Visit
Admission: RE | Admit: 2020-11-08 | Discharge: 2020-11-08 | Disposition: A | Discharge status: Home / Self Care | Payer: Medicare PPO | Source: Ambulatory Visit | Attending: Family Medicine | Admitting: Family Medicine

## 2020-11-08 ENCOUNTER — Other Ambulatory Visit: Payer: Self-pay

## 2020-11-08 DIAGNOSIS — Z1231 Encounter for screening mammogram for malignant neoplasm of breast: Secondary | ICD-10-CM | POA: Insufficient documentation

## 2020-12-02 ENCOUNTER — Ambulatory Visit: Payer: Medicare PPO | Admitting: Family Medicine

## 2020-12-02 ENCOUNTER — Telehealth: Payer: Self-pay

## 2020-12-02 DIAGNOSIS — M810 Age-related osteoporosis without current pathological fracture: Secondary | ICD-10-CM

## 2020-12-02 DIAGNOSIS — E7800 Pure hypercholesterolemia, unspecified: Secondary | ICD-10-CM

## 2020-12-02 MED ORDER — ALENDRONATE SODIUM 70 MG PO TABS: 70.0000 mg | ORAL_TABLET | ORAL | 0 refills | Status: DC

## 2020-12-02 MED ORDER — ATORVASTATIN CALCIUM 10 MG PO TABS: 10.0000 mg | ORAL_TABLET | Freq: Every day | ORAL | 0 refills | Status: DC

## 2020-12-02 NOTE — Telephone Encounter (Signed)
[facility] request for the following medications:  atorvastatin (LIPITOR) 10 MG tablet  alendronate (FOSAMAX) 70 MG tablet   Please advise.

## 2021-02-04 ENCOUNTER — Other Ambulatory Visit: Payer: Self-pay | Admitting: Family Medicine

## 2021-02-04 DIAGNOSIS — M810 Age-related osteoporosis without current pathological fracture: Secondary | ICD-10-CM

## 2021-02-04 NOTE — Telephone Encounter (Signed)
Copied from CRM [id number]. Topic: Quick Communication - Rx Refill/Question >> Feb 04, 2021  1:07 PM Marylen Ponto wrote: Medication: alendronate (FOSAMAX) 70 MG tablet  Has the patient contacted their pharmacy? Yes.  Pt stated her pharmacy told her they had not received response to refill request  (Agent: If no, request that the patient contact the pharmacy for the refill. If patient does not wish to contact the pharmacy document the reason why and proceed with request.) (Agent: If yes, when and what did the pharmacy advise?)  Preferred Pharmacy (with phone number or street name): [id number] [place], [place] [address]  Phone: [phone]  Fax: [phone]  Has the patient been seen for an appointment in the last year OR does the patient have an upcoming appointment? Yes.    Agent: Please be advised that RX refills may take up to 3 business days. We ask that you follow-up with your pharmacy.

## 2021-02-04 NOTE — Telephone Encounter (Signed)
Requested medications are due for refill today requesting a little early  Requested medications are on the active medication list yes  Last visit 02/26/20  Future visit scheduled 03/09/21  Notes to clinic Failed protocol of labs (Vit D level) within 360 days, has upcoming appt, please assess. Requested Prescriptions  Pending Prescriptions Disp Refills   alendronate (FOSAMAX) 70 MG tablet 12 tablet 0    Sig: Take 1 tablet (70 mg total) by mouth every 7 (seven) days. Take with a full glass of water on an empty stomach.     Endocrinology:  Bisphosphonates Failed - 02/04/2021  3:13 PM      Failed - Vitamin D in normal range and within 360 days    No results found for: [id number], [id number], [id number], 25OHVITD3, 25OHVITD2, 25OHVITD3, 25OHVITD2, 25OHVITD1, 25OHVITD2, 25OHVITD3, VD25OH        Passed - Ca in normal range and within 360 days    Calcium  Date Value Ref Range Status  02/26/2020 8.9 8.7 - 10.3 mg/dL Final          Passed - Valid encounter within last 12 months    Recent Outpatient Visits           11 months ago Annual physical exam   [facility] Trey Sailors, [place]   1 year ago Annual physical exam   [facility] [name] M, [place]   2 years ago Essential (primary) hypertension   [facility] [place], [place], [place]   2 years ago Essential (primary) hypertension   [facility] [place], [place], PA-C   3 years ago Chest wall contusion, right, initial encounter   [facility], Molly [place], [place]       Future Appointments             In 1 month Bacigalupo, Marzella Schlein, MD [facility]

## 2021-02-07 MED ORDER — ALENDRONATE SODIUM 70 MG PO TABS: 70.0000 mg | ORAL_TABLET | ORAL | 0 refills | Status: DC

## 2021-03-01 ENCOUNTER — Encounter: Payer: Self-pay | Admitting: Physician Assistant

## 2021-03-02 ENCOUNTER — Telehealth: Payer: Self-pay | Admitting: Family Medicine

## 2021-03-02 ENCOUNTER — Other Ambulatory Visit: Payer: Self-pay

## 2021-03-02 DIAGNOSIS — I10 Essential (primary) hypertension: Secondary | ICD-10-CM

## 2021-03-02 MED ORDER — TRIAMTERENE-HCTZ 37.5-25 MG PO TABS: 1.0000 | ORAL_TABLET | Freq: Every day | ORAL | 0 refills | Status: DC

## 2021-03-02 NOTE — Telephone Encounter (Signed)
Medication Refill - Medication: triamterene-hydrochlorothiazide (MAXZIDE-25) 37.5-25 MG tablet  Has the patient contacted their pharmacy? Yes.   Pt stated pharmacy stated they had to call PCP.  (Agent: If yes, when and what did the pharmacy advise?)  Preferred Pharmacy (with phone number or street name):   [id number] [name], [place] - [address]  [address] [place] [address]  Phone: [phone] Fax: [phone]    Has the patient been seen for an appointment in the last year OR does the patient have an upcoming appointment? Yes.    Agent: Please be advised that RX refills may take up to 3 business days. We ask that you follow-up with your pharmacy.

## 2021-03-09 NOTE — Progress Notes (Signed)
Complete physical exam   Patient: Courtney Rodgers   DOB: 10/29/1950   70 y.o. Female  MRN: [id number] Visit Date: 03/10/2021  Today's healthcare provider: Shirlee Latch, MD   Chief Complaint  Patient presents with   Annual Exam   Subjective    Courtney Rodgers is a 70 y.o. female who presents today for a complete physical exam.  She reports consuming a general diet. Gym/ health club routine includes walking on track  and water aerobics. She generally feels well. She reports sleeping well. She does not have additional problems to discuss today.  HPI  04/12/20 AWV  [facility]  Past Medical History:  Diagnosis Date   Acid reflux    Allergy    Diverticulosis 12/25/2013   Hyperlipidemia    Hypertension    Raynaud's disease    Rheumatoid arthritis (HCC) 01/28/2014   [facility]   Past Surgical History:  Procedure Laterality Date   APPENDECTOMY  1980   CATARACT EXTRACTION Right    COLONOSCOPY  12/25/2013   repeat every 5 years per [facility]; 09/15/2011, 05/18/2008   COLONOSCOPY WITH PROPOFOL N/A 11/13/2019   Procedure: COLONOSCOPY WITH PROPOFOL;  Surgeon: Toledo, Boykin Nearing, MD;  Location: [facility];  Service: Gastroenterology;  Laterality: N/A;   ESOPHAGOGASTRODUODENOSCOPY     ESOPHAGOGASTRODUODENOSCOPY (EGD) WITH PROPOFOL N/A 11/13/2019   Procedure: ESOPHAGOGASTRODUODENOSCOPY (EGD) WITH PROPOFOL;  Surgeon: Toledo, Boykin Nearing, MD;  Location: [facility] END[facility];  Service: Gastroenterology;  Laterality: N/A;   TONSILLECTOMY  1960   Social History   Socioeconomic History   Marital status: Divorced    Spouse name: Not on file   Number of children: 1   Years of education: Not on file   Highest education level: Bachelor's degree (e.g., BA, AB, BS)  Occupational History   Occupation: retired   Occupation: part time subsitute  Tobacco Use   Smoking status: Former    Packs/day: 1.00    Years: 32.00    Pack years: 32.00    Types: Cigarettes    Quit date:  2011    Years since quitting: 11.9   Smokeless tobacco: Never   Tobacco comments:    QUIT IN 2010  Vaping Use   Vaping Use: Never used  Substance and Sexual Activity   Alcohol use: Yes    Alcohol/week: 7.0 standard drinks    Types: 7 Glasses of wine per week    Comment: 1 glass a night   Drug use: No   Sexual activity: Not on file  Other Topics Concern   Not on file  Social History Narrative   Not on file   Social Determinants of Health   Financial Resource Strain: Low Risk    Difficulty of Paying Living Expenses: Not hard at all  Food Insecurity: No Food Insecurity   Worried About Programme researcher, broadcasting/film/video in the Last Year: Never true   Ran Out of Food in the Last Year: Never true  Transportation Needs: No Transportation Needs   Lack of Transportation (Medical): No   Lack of Transportation (Non-Medical): No  Physical Activity: Sufficiently Active   Days of Exercise per Week: 5 days   Minutes of Exercise per Session: 80 min  Stress: No Stress Concern Present   Feeling of Stress : Not at all  Social Connections: Moderately Integrated   Frequency of Communication with Friends and Family: More than three times a week   Frequency of Social Gatherings with Friends and Family: Once a  week   Attends Religious Services: More than 4 times per year   Active Member of Clubs or Organizations: Yes   Attends Engineer, structural: More than 4 times per year   Marital Status: Divorced  Catering manager Violence: Not At Risk   Fear of Current or Ex-Partner: No   Emotionally Abused: No   Physically Abused: No   Sexually Abused: No   Family Status  Relation Name Status   Mother  Deceased   Father  Deceased   Sister 1 Alive   Sister 2 Alive   Sister 3 Alive   Brother 1 Alive   Brother 2 Alive   Brother 3 Alive   Brother 4 Alive   Daughter  Alive   Neg Hx  (Not Specified)   Family History  Problem Relation Age of Onset   Hypertension Mother    Diabetes Mother     Transient ischemic attack Mother    Congestive Heart Failure Mother    Dementia Mother    CVA Father    Emphysema Father    Alcohol abuse Father    Drug abuse Father    Hypertension Father    Hypertension Sister    Hypertension Sister    Hypertension Brother    Seizures Brother    Hypertension Brother    Breast cancer Neg Hx    No Known Allergies  Patient Care Team: Yarnell Arvidson, Marzella Schlein, MD as PCP - General (Family Medicine) Thurnell Garbe, OD (Optometry) Kandyce Rud., MD (Rheumatology)   Medications: Outpatient Medications Prior to Visit  Medication Sig   alendronate (FOSAMAX) 70 MG tablet Take 1 tablet (70 mg total) by mouth every 7 (seven) days. Take with a full glass of water on an empty stomach.   azelastine (OPTIVAR) 0.05 % ophthalmic solution    Biotin 5000 MCG CAPS 1 capsule daily.   Biotin 5000 MCG CAPS Take by mouth.   Calcium Carbonate-Vit D-Min (CALCIUM 1200 PO) 1 tablet daily.   carvedilol (COREG) 12.5 MG tablet Take 12.5 mg by mouth 2 (two) times daily with a meal.   COMBIGAN 0.2-0.5 % ophthalmic solution 1 DROP IN BOTH EYES TWICE A DAY   folic acid (FOLVITE) 800 MCG tablet 1 tablet daily.   latanoprost (XALATAN) 0.005 % ophthalmic solution Place 1 drop into both eyes at bedtime.   methotrexate 2.5 MG tablet 6 tablets once a week.   Multiple Vitamin (MULTI-VITAMIN) tablet Take 1 tablet by mouth daily.   valACYclovir (VALTREX) 1000 MG tablet TAKE 1 TABLET BY MOUTH EVERY DAY   [DISCONTINUED] amLODipine (NORVASC) 5 MG tablet TAKE 1 TABLET BY MOUTH EVERY DAY   [DISCONTINUED] atorvastatin (LIPITOR) 10 MG tablet Take 1 tablet (10 mg total) by mouth daily.   [DISCONTINUED] triamterene-hydrochlorothiazide (MAXZIDE-25) 37.5-25 MG tablet Take 1 tablet by mouth daily.   [DISCONTINUED] Aspirin 81 MG EC tablet 1 tablet daily. (Patient not taking: No sig reported)   [DISCONTINUED] Omega-3 Fatty Acids (FISH OIL BURP-LESS) 1000 MG CAPS Take 1 capsule by mouth daily.     No facility-administered medications prior to visit.    Review of Systems  Constitutional:  Positive for unexpected weight change.  HENT:  Positive for dental problem.   Eyes: Negative.   Respiratory: Negative.    Cardiovascular: Negative.   Gastrointestinal: Negative.   Endocrine: Negative.   Genitourinary: Negative.   Musculoskeletal: Negative.   Skin: Negative.   Allergic/Immunologic: Negative.   Neurological: Negative.   Hematological: Negative.   Psychiatric/Behavioral: Negative.  Last CBC Lab Results  Component Value Date   WBC 5.9 02/26/2020   HGB 12.9 02/26/2020   HCT 35.9 02/26/2020   MCV 101 (H) 02/26/2020   MCH 36.2 (H) 02/26/2020   RDW 11.9 02/26/2020   PLT 191 02/26/2020   Last metabolic panel Lab Results  Component Value Date   GLUCOSE 112 (H) 02/26/2020   NA 137 02/26/2020   K 3.4 (L) 02/26/2020   CL 98 02/26/2020   CO2 24 02/26/2020   BUN 15 02/26/2020   CREATININE 0.68 02/26/2020   GFRNONAA 91 02/26/2020   CALCIUM 8.9 02/26/2020   PROT 7.1 02/26/2020   ALBUMIN 4.5 02/26/2020   LABGLOB 2.6 02/26/2020   AGRATIO 1.7 02/26/2020   BILITOT 0.6 02/26/2020   ALKPHOS 49 02/26/2020   AST 20 02/26/2020   ALT 15 02/26/2020   Last lipids Lab Results  Component Value Date   CHOL 201 (H) 02/26/2020   HDL 94 02/26/2020   LDLCALC 87 02/26/2020   TRIG 116 02/26/2020   CHOLHDL 2.1 02/26/2020   Last hemoglobin A1c Lab Results  Component Value Date   HGBA1C 5.2 02/26/2020   Last thyroid functions Lab Results  Component Value Date   TSH 0.752 02/26/2020      Objective    BP 138/70 (BP Location: Right Arm, Patient Position: Sitting, Cuff Size: Normal) Comment: manually   Pulse 82    Temp 97.7 F (36.5 C) (Oral)    Resp 16    Ht 5\' 4"  (1.626 m)    Wt 160 lb 12.8 oz (72.9 kg)    BMI 27.60 kg/m  BP Readings from Last 3 Encounters:  03/10/21 138/70  02/26/20 120/65  11/13/19 (!) 154/74   Wt Readings from Last 3 Encounters:  03/10/21 160  lb 12.8 oz (72.9 kg)  02/26/20 151 lb 4.8 oz (68.6 kg)  11/13/19 146 lb (66.2 kg)      Physical Exam Vitals reviewed.  Constitutional:      General: She is not in acute distress.    Appearance: Normal appearance. She is well-developed. She is not diaphoretic.  HENT:     Head: Normocephalic and atraumatic.     Right Ear: Tympanic membrane, ear canal and external ear normal.     Left Ear: Tympanic membrane, ear canal and external ear normal.     Nose: Nose normal.     Mouth/Throat:     Mouth: Mucous membranes are moist.     Pharynx: Oropharynx is clear. No oropharyngeal exudate.  Eyes:     General: No scleral icterus.    Conjunctiva/sclera: Conjunctivae normal.     Pupils: Pupils are equal, round, and reactive to light.  Neck:     Thyroid: No thyromegaly.  Cardiovascular:     Rate and Rhythm: Normal rate and regular rhythm.     Pulses: Normal pulses.     Heart sounds: Normal heart sounds. No murmur heard. Pulmonary:     Effort: Pulmonary effort is normal. No respiratory distress.     Breath sounds: Normal breath sounds. No wheezing or rales.  Abdominal:     General: There is no distension.     Palpations: Abdomen is soft.     Tenderness: There is no abdominal tenderness.  Musculoskeletal:        General: No deformity.     Cervical back: Neck supple.     Right lower leg: No edema.     Left lower leg: No edema.  Lymphadenopathy:  Cervical: No cervical adenopathy.  Skin:    General: Skin is warm and dry.     Findings: No rash.  Neurological:     Mental Status: She is alert and oriented to person, place, and time. Mental status is at baseline.     Sensory: No sensory deficit.     Motor: No weakness.     Gait: Gait normal.  Psychiatric:        Mood and Affect: Mood normal.        Behavior: Behavior normal.        Thought Content: Thought content normal.      Last depression screening scores PHQ 2/9 Scores 04/12/2020 02/26/2020 02/25/2019  PHQ - 2 Score 0 1 1   PHQ- 9 Score - 5 5   Last fall risk screening Fall Risk  04/12/2020  Falls in the past year? 0  Number falls in past yr: 0  Injury with Fall? 0  Risk for fall due to : -  Follow up -   Last Audit-C alcohol use screening Alcohol Use Disorder Test (AUDIT) 04/12/2020  1. How often do you have a drink containing alcohol? 4  2. How many drinks containing alcohol do you have on a typical day when you are drinking? 0  3. How often do you have six or more drinks on one occasion? 0  AUDIT-C Score 4  4. How often during the last year have you found that you were not able to stop drinking once you had started? 0  5. How often during the last year have you failed to do what was normally expected from you because of drinking? 0  6. How often during the last year have you needed a first drink in the morning to get yourself going after a heavy drinking session? 0  7. How often during the last year have you had a feeling of guilt of remorse after drinking? 0  8. How often during the last year have you been unable to remember what happened the night before because you had been drinking? 0  9. Have you or someone else been injured as a result of your drinking? 0  10. Has a relative or friend or a doctor or another health worker been concerned about your drinking or suggested you cut down? 0  Alcohol Use Disorder Identification Test Final Score (AUDIT) 4  Alcohol Brief Interventions/Follow-up -   A score of 3 or more in women, and 4 or more in men indicates increased risk for alcohol abuse, EXCEPT if all of the points are from question 1   No results found for any visits on 03/10/21.  Assessment & Plan    Routine Health Maintenance and Physical Exam  Exercise Activities and Dietary recommendations  Goals      DIET - EAT MORE FRUITS AND VEGETABLES     Recommend to start eating 2 servings of fruit every day.         Immunization History  Administered Date(s) Administered   Influenza Inj Mdck  Quad Pf 01/08/2017   Influenza Split 02/01/2011   Influenza, High Dose Seasonal PF 01/22/2018, 12/20/2018   Influenza,inj,Quad PF,6+ Mos 12/21/2014   Influenza-Unspecified 12/20/2018, 12/28/2019, 12/01/2020   PFIZER(Purple Top)SARS-COV-2 Vaccination 04/11/2019, 05/02/2019, 01/14/2020   Pneumococcal Conjugate-13 08/22/2017   Pneumococcal Polysaccharide-23 02/25/2019   Tdap 10/18/2009   Zoster Recombinat (Shingrix) 04/08/2020, 07/22/2020   Zoster, Live 04/24/2013    Health Maintenance  Topic Date Due   TETANUS/TDAP  10/19/2019  COVID-19 Vaccine (4 - Booster for Pfizer series) 03/10/2020   DEXA SCAN  04/19/2022   MAMMOGRAM  11/09/2022   COLONOSCOPY (Pts 45-54yrs Insurance coverage will need to be confirmed)  11/12/2024   Pneumonia Vaccine 45+ Years old  Completed   INFLUENZA VACCINE  Completed   Hepatitis C Screening  Completed   Zoster Vaccines- Shingrix  Completed   HPV VACCINES  Aged Out    Discussed health benefits of physical activity, and encouraged her to engage in regular exercise appropriate for her age and condition.  Problem List Items Addressed This Visit       Cardiovascular and Mediastinum   Essential (primary) hypertension    Well controlled Continue current medications Recheck metabolic panel F/u in 6 months       Relevant Medications   triamterene-hydrochlorothiazide (MAXZIDE-25) 37.5-25 MG tablet   atorvastatin (LIPITOR) 10 MG tablet   amLODipine (NORVASC) 5 MG tablet   Other Relevant Orders   Comprehensive metabolic panel   Paroxysmal digital cyanosis   Relevant Medications   triamterene-hydrochlorothiazide (MAXZIDE-25) 37.5-25 MG tablet   atorvastatin (LIPITOR) 10 MG tablet   amLODipine (NORVASC) 5 MG tablet     Musculoskeletal and Integument   Arthritis or polyarthritis, rheumatoid (HCC)    F/b [facility] No changes to meds Reviewed recent CBC        Other   Hypercholesteremia    Previously well controlled Continue statin Repeat FLP and  CMP      Relevant Medications   triamterene-hydrochlorothiazide (MAXZIDE-25) 37.5-25 MG tablet   atorvastatin (LIPITOR) 10 MG tablet   amLODipine (NORVASC) 5 MG tablet   Other Relevant Orders   Comprehensive metabolic panel   Lipid Panel With LDL/HDL Ratio   Annual physical exam - Primary   Relevant Orders   Comprehensive metabolic panel   Lipid Panel With LDL/HDL Ratio   Hemoglobin A1c   Other Visit Diagnoses     Hyperglycemia       Relevant Orders   Hemoglobin A1c   Essential hypertension       Relevant Medications   triamterene-hydrochlorothiazide (MAXZIDE-25) 37.5-25 MG tablet   atorvastatin (LIPITOR) 10 MG tablet   amLODipine (NORVASC) 5 MG tablet        Return in about 6 months (around 09/08/2021) for chronic disease f/u.     I, Shirlee Latch, MD, have reviewed all documentation for this visit. The documentation on 03/10/21 for the exam, diagnosis, procedures, and orders are all accurate and complete.   Darrin Apodaca, Marzella Schlein, MD, MPH [facility]

## 2021-03-10 ENCOUNTER — Ambulatory Visit (INDEPENDENT_AMBULATORY_CARE_PROVIDER_SITE_OTHER): Payer: Medicare PPO | Admitting: Family Medicine

## 2021-03-10 ENCOUNTER — Other Ambulatory Visit: Payer: Self-pay

## 2021-03-10 ENCOUNTER — Encounter: Payer: Self-pay | Admitting: Family Medicine

## 2021-03-10 VITALS — BP 138/70 | HR 82 | Temp 97.7°F | Resp 16 | Ht 64.0 in | Wt 160.8 lb

## 2021-03-10 DIAGNOSIS — E7800 Pure hypercholesterolemia, unspecified: Secondary | ICD-10-CM

## 2021-03-10 DIAGNOSIS — Z0000 Encounter for general adult medical examination without abnormal findings: Secondary | ICD-10-CM | POA: Diagnosis not present

## 2021-03-10 DIAGNOSIS — M069 Rheumatoid arthritis, unspecified: Secondary | ICD-10-CM

## 2021-03-10 DIAGNOSIS — I7300 Raynaud's syndrome without gangrene: Secondary | ICD-10-CM

## 2021-03-10 DIAGNOSIS — I10 Essential (primary) hypertension: Secondary | ICD-10-CM

## 2021-03-10 DIAGNOSIS — R739 Hyperglycemia, unspecified: Secondary | ICD-10-CM

## 2021-03-10 MED ORDER — AMLODIPINE BESYLATE 5 MG PO TABS
5.0000 mg | ORAL_TABLET | Freq: Every day | ORAL | 3 refills | Status: DC
Start: 2021-03-10 — End: 2022-03-09

## 2021-03-10 MED ORDER — TRIAMTERENE-HCTZ 37.5-25 MG PO TABS: 1.0000 | ORAL_TABLET | Freq: Every day | ORAL | 3 refills | Status: DC

## 2021-03-10 MED ORDER — ATORVASTATIN CALCIUM 10 MG PO TABS
10.0000 mg | ORAL_TABLET | Freq: Every day | ORAL | 3 refills | Status: DC
Start: 2021-03-10 — End: 2022-03-16

## 2021-03-10 NOTE — Assessment & Plan Note (Signed)
Well controlled Continue current medications Recheck metabolic panel F/u in 6 months  

## 2021-03-10 NOTE — Patient Instructions (Signed)
Ask at the pharmacy about the TDAP (tetanus vaccine) and the COVID bivalent booster

## 2021-03-10 NOTE — Assessment & Plan Note (Signed)
Previously well controlled Continue statin Repeat FLP and CMP  

## 2021-03-10 NOTE — Assessment & Plan Note (Signed)
F/b [facility] No changes to meds Reviewed recent CBC

## 2021-05-03 ENCOUNTER — Other Ambulatory Visit: Payer: Self-pay | Admitting: Family Medicine

## 2021-05-03 DIAGNOSIS — M810 Age-related osteoporosis without current pathological fracture: Secondary | ICD-10-CM

## 2021-05-03 NOTE — Telephone Encounter (Signed)
Medication Refill - Medication: alendronate (FOSAMAX) 70 MG tablet   Has the patient contacted their pharmacy? Yes.   (Agent: If no, request that the patient contact the pharmacy for the refill. If patient does not wish to contact the pharmacy document the reason why and proceed with request.) (Agent: If yes, when and what did the pharmacy advise?)sent request yesterday   Preferred Pharmacy (with phone number or street name): [id number] [name] - [address]  [address], [place] [address]  Phone:  [phone]  Fax:  [phone]  Has the patient been seen for an appointment in the last year OR does the patient have an upcoming appointment? Yes.    Agent: Please be advised that RX refills may take up to 3 business days. We ask that you follow-up with your pharmacy.

## 2021-05-04 MED ORDER — ALENDRONATE SODIUM 70 MG PO TABS: 70.0000 mg | ORAL_TABLET | ORAL | 0 refills | Status: DC

## 2021-05-04 NOTE — Telephone Encounter (Signed)
Requested medications are due for refill today.  yes  Requested medications are on the active medications list.  yes  Last refill. 02/07/2021 12/0 refills  Future visit scheduled.   yes  Notes to clinic.  Failed protocol d/t expired labs.    Requested Prescriptions  Pending Prescriptions Disp Refills   alendronate (FOSAMAX) 70 MG tablet 12 tablet 0    Sig: Take 1 tablet (70 mg total) by mouth every 7 (seven) days. Take with a full glass of water on an empty stomach.     Endocrinology:  Bisphosphonates Failed - 05/03/2021  5:59 PM      Failed - Ca in normal range and within 360 days    Calcium  Date Value Ref Range Status  02/26/2020 8.9 8.7 - 10.3 mg/dL Final          Failed - Vitamin D in normal range and within 360 days    No results found for: HO1224MG5, OI3704UG8, [id number], 25OHVITD3, 25OHVITD2, 25OHVITD3, 25OHVITD2, 25OHVITD1, 25OHVITD2, 25OHVITD3, VD25OH        Failed - Cr in normal range and within 360 days    Creat  Date Value Ref Range Status  02/02/2017 0.88 0.50 - 0.99 mg/dL Final    Comment:    For patients >11 years of age, the reference limit for Creatinine is approximately 13% higher for people identified as African-American. .    Creatinine, Ser  Date Value Ref Range Status  02/26/2020 0.68 0.57 - 1.00 mg/dL Final          Failed - Mg Level in normal range and within 360 days    No results found for: MG        Failed - Phosphate in normal range and within 360 days    No results found for: PHOS        Failed - eGFR is 30 or above and within 360 days    GFR, Est African American  Date Value Ref Range Status  02/02/2017 80 > OR = 60 mL/min/1.80m Final   GFR calc Af Amer  Date Value Ref Range Status  02/26/2020 105 >59 mL/min/1.73 Final    Comment:    **In accordance with recommendations from the NKF-ASN Task force,**   [facility] is in the process of updating its eGFR calculation to the   2021 CKD-EPI creatinine equation that estimates  kidney function   without a race variable.    GFR, Est Non African American  Date Value Ref Range Status  02/02/2017 69 > OR = 60 mL/min/1.719mFinal   GFR calc non Af Amer  Date Value Ref Range Status  02/26/2020 91 >59 mL/min/1.73 Final          Passed - Valid encounter within last 12 months    Recent Outpatient Visits           1 month ago Annual physical [facility]   1 year ago Annual physical exam   [facility] Essential (primary) hypertension   [facility]   3 years ago Essential (primary) hypertension   [facility]       Future Appointments             In 4 months Bacigalupo, [facility]ionne [facility]            Passed - Bone Mineral Density or Dexa Scan completed in the last 2 years

## 2021-05-10 DIAGNOSIS — Z79899 Other long term (current) drug therapy: Secondary | ICD-10-CM | POA: Diagnosis not present

## 2021-05-10 DIAGNOSIS — M0579 Rheumatoid arthritis with rheumatoid factor of multiple sites without organ or systems involvement: Secondary | ICD-10-CM | POA: Diagnosis not present

## 2021-05-23 ENCOUNTER — Encounter: Payer: Self-pay | Admitting: Physician Assistant

## 2021-05-23 ENCOUNTER — Other Ambulatory Visit: Payer: Self-pay

## 2021-05-23 ENCOUNTER — Ambulatory Visit: Payer: Medicare PPO | Admitting: Physician Assistant

## 2021-05-23 VITALS — BP 152/81 | HR 76 | Wt 156.3 lb

## 2021-05-23 DIAGNOSIS — T180XXA Foreign body in mouth, initial encounter: Secondary | ICD-10-CM

## 2021-05-23 NOTE — Progress Notes (Signed)
?  ? ? ?Established patient visit ? ? ?Patient: Courtney Rodgers   DOB: Jun 11, 1950   71 y.o. Female  MRN: [id number] ?Visit Date: 05/23/2021 ? ?Today's healthcare provider: Debera Lat, PA-C  ? ?Chief Complaint  ?Patient presents with  ? something stuck in jaw  ? ?Subjective  ?  ?HPI ?Pt reports she has a "peanut lodged in right  jaw..like it didn't go down...at least " Per pt, it happened 10 days ago. ?Patient reports soreness and pain with a side movement to the right; endorses no fever, no discharge, no drooling, no problems with swallowing food, no enlarged lymph nodes, no nausea or vomiting. ?Per chart review, patient was diagnosed with Barrett's esophagus.  Her last EGD from 2021 showed a minute focus of intestinal metaplasia thought to represent Barrett's esophagus.  Patient has not taken PPIs.  Will need to repeat EGD in 2024 ? ?Medications: ?Outpatient Medications Prior to Visit  ?Medication Sig  ? alendronate (FOSAMAX) 70 MG tablet Take 1 tablet (70 mg total) by mouth every 7 (seven) days. Take with a full glass of water on an empty stomach.  ? amLODipine (NORVASC) 5 MG tablet Take 1 tablet (5 mg total) by mouth daily.  ? atorvastatin (LIPITOR) 10 MG tablet Take 1 tablet (10 mg total) by mouth daily.  ? azelastine (OPTIVAR) 0.05 % ophthalmic solution   ? Biotin 5000 MCG CAPS 1 capsule daily.  ? Biotin 5000 MCG CAPS Take by mouth.  ? Calcium Carbonate-Vit D-Min (CALCIUM 1200 PO) 1 tablet daily.  ? carvedilol (COREG) 12.5 MG tablet Take 12.5 mg by mouth 2 (two) times daily with a meal.  ? COMBIGAN 0.2-0.5 % ophthalmic solution 1 DROP IN BOTH EYES TWICE A DAY  ? folic acid (FOLVITE) 800 MCG tablet 1 tablet daily.  ? latanoprost (XALATAN) 0.005 % ophthalmic solution Place 1 drop into both eyes at bedtime.  ? methotrexate 2.5 MG tablet 6 tablets once a week.  ? Multiple Vitamin (MULTI-VITAMIN) tablet Take 1 tablet by mouth daily.  ? triamterene-hydrochlorothiazide (MAXZIDE-25) 37.5-25 MG tablet Take 1 tablet  by mouth daily.  ? valACYclovir (VALTREX) 1000 MG tablet TAKE 1 TABLET BY MOUTH EVERY DAY  ? ?No facility-administered medications prior to visit.  ? ? ?Review of Systems  ?Constitutional: Negative.   ?HENT:  Positive for rhinorrhea. Negative for congestion, dental problem, drooling, ear discharge, ear pain, facial swelling, mouth sores, postnasal drip, sinus pressure, sinus pain, sore throat, trouble swallowing and voice change.   ?Respiratory:  Negative for cough, choking, chest tightness and shortness of breath.   ?Cardiovascular:  Negative for chest pain and palpitations.  ?All other systems reviewed and are negative. ? ? ?  Objective  ?  ?BP (!) 152/81 (BP Location: Right Arm, Patient Position: Sitting, Cuff Size: Normal)   Pulse 76   Wt 156 lb 4.8 oz (70.9 kg)   BMI 26.83 kg/m?  ? ?Physical Exam ?Vitals and nursing note reviewed.  ?Constitutional:   ?   General: She is not in acute distress. ?   Appearance: Normal appearance.  ?HENT:  ?   Head: Normocephalic and atraumatic.  ?   Nose: No rhinorrhea.  ?   Mouth/Throat:  ?   Mouth: Mucous membranes are moist.  ?   Pharynx: Oropharynx is clear. No oropharyngeal exudate or posterior oropharyngeal erythema.  ?Eyes:  ?   Pupils: Pupils are equal, round, and reactive to light.  ?Pulmonary:  ?   Effort: Pulmonary effort is normal.  ?Musculoskeletal:  ?  Cervical back: Tenderness (on palpation , under the jaw angle) present.  ?Lymphadenopathy:  ?   Cervical: No cervical adenopathy.  ?Skin: ?   Findings: Bruising present.  ?Neurological:  ?   Mental Status: She is alert.  ?  ? Assessment & Plan  ?  ? ?1. Foreign body in mouth, initial encounter ?No foreign body was found on PE, no lymphoadenopathy or sialoadenitis ?- Ambulatory referral to ENT for evaluation including for her Barrett's esophagus? ?Might need to start on PPI. ?- Fu in a month and see her PCP afterwards. ?   ?The patient was advised to call back or seek an in-person evaluation if the symptoms worsen  or if the condition fails to improve as anticipated. ? ?I discussed the assessment and treatment plan with the patient. The patient was provided an opportunity to ask questions and all were answered. The patient agreed with the plan and demonstrated an understanding of the instructions. ? ?The entirety of the information documented in the History of Present Illness, Review of Systems and Physical Exam were personally obtained by me. Portions of this information were initially documented by the CMA and reviewed by me for thoroughness and accuracy.   ? ? ?Debera Lat, PA-C  ?[facility] ?[phone] (phone) ?[phone] (fax) ? ?[facility]  ?

## 2021-06-06 ENCOUNTER — Ambulatory Visit: Payer: Medicare PPO | Admitting: Physician Assistant

## 2021-06-16 DIAGNOSIS — H2512 Age-related nuclear cataract, left eye: Secondary | ICD-10-CM | POA: Diagnosis not present

## 2021-06-16 DIAGNOSIS — H35371 Puckering of macula, right eye: Secondary | ICD-10-CM | POA: Diagnosis not present

## 2021-06-16 DIAGNOSIS — H401232 Low-tension glaucoma, bilateral, moderate stage: Secondary | ICD-10-CM | POA: Diagnosis not present

## 2021-06-16 DIAGNOSIS — H04123 Dry eye syndrome of bilateral lacrimal glands: Secondary | ICD-10-CM | POA: Diagnosis not present

## 2021-06-16 DIAGNOSIS — H26491 Other secondary cataract, right eye: Secondary | ICD-10-CM | POA: Diagnosis not present

## 2021-06-28 DIAGNOSIS — M0579 Rheumatoid arthritis with rheumatoid factor of multiple sites without organ or systems involvement: Secondary | ICD-10-CM | POA: Diagnosis not present

## 2021-06-28 DIAGNOSIS — Z79899 Other long term (current) drug therapy: Secondary | ICD-10-CM | POA: Diagnosis not present

## 2021-07-06 ENCOUNTER — Other Ambulatory Visit: Payer: Self-pay | Admitting: Family Medicine

## 2021-07-06 DIAGNOSIS — B009 Herpesviral infection, unspecified: Secondary | ICD-10-CM

## 2021-07-06 NOTE — Telephone Encounter (Signed)
[facility] faxed refill request for the following medications:   valACYclovir (VALTREX) 1000 MG tablet   Please advise.  

## 2021-07-07 DIAGNOSIS — Z79899 Other long term (current) drug therapy: Secondary | ICD-10-CM | POA: Diagnosis not present

## 2021-07-07 DIAGNOSIS — M0579 Rheumatoid arthritis with rheumatoid factor of multiple sites without organ or systems involvement: Secondary | ICD-10-CM | POA: Diagnosis not present

## 2021-07-07 DIAGNOSIS — I7300 Raynaud's syndrome without gangrene: Secondary | ICD-10-CM | POA: Diagnosis not present

## 2021-07-07 DIAGNOSIS — M0689 Other specified rheumatoid arthritis, multiple sites: Secondary | ICD-10-CM | POA: Diagnosis not present

## 2021-07-07 MED ORDER — VALACYCLOVIR HCL 1 G PO TABS: 1000.0000 mg | ORAL_TABLET | Freq: Every day | ORAL | 2 refills | Status: DC

## 2021-07-29 ENCOUNTER — Other Ambulatory Visit: Payer: Self-pay | Admitting: Family Medicine

## 2021-07-29 DIAGNOSIS — M810 Age-related osteoporosis without current pathological fracture: Secondary | ICD-10-CM

## 2021-07-29 NOTE — Telephone Encounter (Signed)
Pt called to report that she is completely out of her current supply 

## 2021-08-02 ENCOUNTER — Ambulatory Visit (INDEPENDENT_AMBULATORY_CARE_PROVIDER_SITE_OTHER): Payer: Medicare PPO

## 2021-08-02 VITALS — Wt 156.0 lb

## 2021-08-02 DIAGNOSIS — Z0000 Encounter for general adult medical examination without abnormal findings: Secondary | ICD-10-CM | POA: Diagnosis not present

## 2021-08-02 NOTE — Patient Instructions (Signed)
Courtney Rodgers , ?Thank you for taking time to come for your Medicare Wellness Visit. I appreciate your ongoing commitment to your health goals. Please review the following plan we discussed and let me know if I can assist you in the future.  ? ?Screening recommendations/referrals: ?Colonoscopy: 11/13/19 ?Mammogram: 11/08/20 ?Bone Density: 04/19/20 ?Recommended yearly ophthalmology/optometry visit for glaucoma screening and checkup ?Recommended yearly dental visit for hygiene and checkup ? ?Vaccinations: ?Influenza vaccine: 12/01/20 ?Pneumococcal vaccine: 02/25/19 ?Tdap vaccine: 10/18/09, due ?Shingles vaccine: Shingrix 04/08/20, 07/22/20  Zostavax 04/24/13   ?Covid-19:04/11/19, 05/02/19, 01/14/20, 12/08/20, 06/07/21 ? ?Advanced directives: no ? ?Conditions/risks identified: none ? ?Next appointment: Follow up in one year for your annual wellness visit 08/07/22 @ 1pm by phone ? ? ?Preventive Care 26 Years and Older, Female ?Preventive care refers to lifestyle choices and visits with your health care provider that can promote health and wellness. ?What does preventive care include? ?A yearly physical exam. This is also called an annual well check. ?Dental exams once or twice a year. ?Routine eye exams. Ask your health care provider how often you should have your eyes checked. ?Personal lifestyle choices, including: ?Daily care of your teeth and gums. ?Regular physical activity. ?Eating a healthy diet. ?Avoiding tobacco and drug use. ?Limiting alcohol use. ?Practicing safe sex. ?Taking low-dose aspirin every day. ?Taking vitamin and mineral supplements as recommended by your health care provider. ?What happens during an annual well check? ?The services and screenings done by your health care provider during your annual well check will depend on your age, overall health, lifestyle risk factors, and family history of disease. ?Counseling  ?Your health care provider may ask you questions about your: ?Alcohol use. ?Tobacco use. ?Drug  use. ?Emotional well-being. ?Home and relationship well-being. ?Sexual activity. ?Eating habits. ?History of falls. ?Memory and ability to understand (cognition). ?Work and work Astronomer. ?Reproductive health. ?Screening  ?You may have the following tests or measurements: ?Height, weight, and BMI. ?Blood pressure. ?Lipid and cholesterol levels. These may be checked every 5 years, or more frequently if you are over 53 years old. ?Skin check. ?Lung cancer screening. You may have this screening every year starting at age 52 if you have a 30-pack-year history of smoking and currently smoke or have quit within the past 15 years. ?Fecal occult blood test (FOBT) of the stool. You may have this test every year starting at age 26. ?Flexible sigmoidoscopy or colonoscopy. You may have a sigmoidoscopy every 5 years or a colonoscopy every 10 years starting at age 28. ?Hepatitis C blood test. ?Hepatitis B blood test. ?Sexually transmitted disease (STD) testing. ?Diabetes screening. This is done by checking your blood sugar (glucose) after you have not eaten for a while (fasting). You may have this done every 1-3 years. ?Bone density scan. This is done to screen for osteoporosis. You may have this done starting at age 32. ?Mammogram. This may be done every 1-2 years. Talk to your health care provider about how often you should have regular mammograms. ?Talk with your health care provider about your test results, treatment options, and if necessary, the need for more tests. ?Vaccines  ?Your health care provider may recommend certain vaccines, such as: ?Influenza vaccine. This is recommended every year. ?Tetanus, diphtheria, and acellular pertussis (Tdap, Td) vaccine. You may need a Td booster every 10 years. ?Zoster vaccine. You may need this after age 54. ?Pneumococcal 13-valent conjugate (PCV13) vaccine. One dose is recommended after age 37. ?Pneumococcal polysaccharide (PPSV23) vaccine. One dose is recommended after  age  30. ?Talk to your health care provider about which screenings and vaccines you need and how often you need them. ?This information is not intended to replace advice given to you by your health care provider. Make sure you discuss any questions you have with your health care provider. ?Document Released: 04/02/2015 Document Revised: 11/24/2015 Document Reviewed: 01/05/2015 ?Elsevier Interactive Patient Education ? 2017 [place]. ? ?Fall Prevention in the Home ?Falls can cause injuries. They can happen to people of all ages. There are many things you can do to make your home safe and to help prevent falls. ?What can I do on the outside of my home? ?Regularly fix the edges of walkways and driveways and fix any cracks. ?Remove anything that might make you trip as you walk through a door, such as a raised step or threshold. ?Trim any bushes or trees on the path to your home. ?Use bright outdoor lighting. ?Clear any walking paths of anything that might make someone trip, such as rocks or tools. ?Regularly check to see if handrails are loose or broken. Make sure that both sides of any steps have handrails. ?Any raised decks and porches should have guardrails on the edges. ?Have any leaves, snow, or ice cleared regularly. ?Use sand or salt on walking paths during winter. ?Clean up any spills in your garage right away. This includes oil or grease spills. ?What can I do in the bathroom? ?Use night lights. ?Install grab bars by the toilet and in the tub and shower. Do not use towel bars as grab bars. ?Use non-skid mats or decals in the tub or shower. ?If you need to sit down in the shower, use a plastic, non-slip stool. ?Keep the floor dry. Clean up any water that spills on the floor as soon as it happens. ?Remove soap buildup in the tub or shower regularly. ?Attach bath mats securely with double-sided non-slip rug tape. ?Do not have throw rugs and other things on the floor that can make you trip. ?What can I do in the  bedroom? ?Use night lights. ?Make sure that you have a light by your bed that is easy to reach. ?Do not use any sheets or blankets that are too big for your bed. They should not hang down onto the floor. ?Have a firm chair that has side arms. You can use this for support while you get dressed. ?Do not have throw rugs and other things on the floor that can make you trip. ?What can I do in the kitchen? ?Clean up any spills right away. ?Avoid walking on wet floors. ?Keep items that you use a lot in easy-to-reach places. ?If you need to reach something above you, use a strong step stool that has a grab bar. ?Keep electrical cords out of the way. ?Do not use floor polish or wax that makes floors slippery. If you must use wax, use non-skid floor wax. ?Do not have throw rugs and other things on the floor that can make you trip. ?What can I do with my stairs? ?Do not leave any items on the stairs. ?Make sure that there are handrails on both sides of the stairs and use them. Fix handrails that are broken or loose. Make sure that handrails are as long as the stairways. ?Check any carpeting to make sure that it is firmly attached to the stairs. Fix any carpet that is loose or worn. ?Avoid having throw rugs at the top or bottom of the stairs. If you do  have throw rugs, attach them to the floor with carpet tape. ?Make sure that you have a light switch at the top of the stairs and the bottom of the stairs. If you do not have them, ask someone to add them for you. ?What else can I do to help prevent falls? ?Wear shoes that: ?Do not have high heels. ?Have rubber bottoms. ?Are comfortable and fit you well. ?Are closed at the toe. Do not wear sandals. ?If you use a stepladder: ?Make sure that it is fully opened. Do not climb a closed stepladder. ?Make sure that both sides of the stepladder are locked into place. ?Ask someone to hold it for you, if possible. ?Clearly mark and make sure that you can see: ?Any grab bars or  handrails. ?First and last steps. ?Where the edge of each step is. ?Use tools that help you move around (mobility aids) if they are needed. These include: ?Canes. ?Walkers. ?Scooters. ?Crutches. ?Turn on the lights when you go

## 2021-08-02 NOTE — Progress Notes (Signed)
Virtual Visit via Telephone Note  I connected with  Courtney Rodgers on 08/02/21 at  1:00 PM EDT by telephone and verified that I am speaking with the correct person using two identifiers.  Location: Patient: home Provider: [facility] Persons participating in the virtual visit: patient/Nurse Health Advisor   I discussed the limitations, risks, security and privacy concerns of performing an evaluation and management service by telephone and the availability of in person appointments. The patient expressed understanding and agreed to proceed.  Interactive audio and video telecommunications were attempted between this nurse and patient, however failed, due to patient having technical difficulties OR patient did not have access to video capability.  We continued and completed visit with audio only.  Some vital signs may be absent or patient reported.   Hal Hope, LPN  Subjective:   Courtney Rodgers is a 71 y.o. female who presents for Medicare Annual (Subsequent) preventive examination.  Review of Systems    no       Objective:    There were no vitals filed for this visit. There is no height or weight on file to calculate BMI.     04/12/2020    3:11 PM 11/13/2019    9:32 AM  Advanced Directives  Does Patient Have a Medical Advance Directive? No No  Would patient like information on creating a medical advance directive? No - Patient declined No - Patient declined    Current Medications (verified) Outpatient Encounter Medications as of 08/02/2021  Medication Sig   alendronate (FOSAMAX) 70 MG tablet TAKE 1 TABLET (70 MG TOTAL) BY MOUTH EVERY 7 DAYS WITH FULL GLASS WATER ON EMPTY STOMACH   amLODipine (NORVASC) 5 MG tablet Take 1 tablet (5 mg total) by mouth daily.   aspirin 81 MG chewable tablet Chew by mouth.   atorvastatin (LIPITOR) 10 MG tablet Take 1 tablet (10 mg total) by mouth daily.   azelastine (OPTIVAR) 0.05 % ophthalmic solution    Biotin 5000 MCG CAPS 1 capsule  daily.   Calcium Carbonate-Vit D-Min (CALCIUM 1200 PO) 1 tablet daily.   carvedilol (COREG) 12.5 MG tablet Take 12.5 mg by mouth 2 (two) times daily with a meal.   COMBIGAN 0.2-0.5 % ophthalmic solution 1 DROP IN BOTH EYES TWICE A DAY   folic acid (FOLVITE) 800 MCG tablet 1 tablet daily.   latanoprost (XALATAN) 0.005 % ophthalmic solution Place 1 drop into both eyes at bedtime.   methotrexate 2.5 MG tablet 6 tablets once a week.   Multiple Vitamin (MULTI-VITAMIN) tablet Take 1 tablet by mouth daily.   triamterene-hydrochlorothiazide (MAXZIDE-25) 37.5-25 MG tablet Take 1 tablet by mouth daily.   valACYclovir (VALTREX) 1000 MG tablet Take 1 tablet (1,000 mg total) by mouth daily.   [DISCONTINUED] Biotin 5000 MCG CAPS Take by mouth.   No facility-administered encounter medications on file as of 08/02/2021.    Allergies (verified) Patient has no known allergies.   History: Past Medical History:  Diagnosis Date   Acid reflux    Allergy    Diverticulosis 12/25/2013   Hyperlipidemia    Hypertension    Raynaud's disease    Rheumatoid arthritis (HCC) 01/28/2014   [facility]   Past Surgical History:  Procedure Laterality Date   APPENDECTOMY  1980   CATARACT EXTRACTION Right    COLONOSCOPY  12/25/2013   repeat every 5 years per [facility]; 09/15/2011, 05/18/2008   COLONOSCOPY WITH PROPOFOL N/A 11/13/2019   Procedure: COLONOSCOPY WITH PROPOFOL;  Surgeon: Norma Fredrickson, Boykin Nearing, MD;  Location: [facility];  Service: Gastroenterology;  Laterality: N/A;   ESOPHAGOGASTRODUODENOSCOPY     ESOPHAGOGASTRODUODENOSCOPY (EGD) WITH PROPOFOL N/A 11/13/2019   Procedure: ESOPHAGOGASTRODUODENOSCOPY (EGD) WITH PROPOFOL;  Surgeon: Toledo, Boykin Nearingeodoro K, MD;  Location: [facility];  Service: Gastroenterology;  Laterality: N/A;   TONSILLECTOMY  1960   Family History  Problem Relation Age of Onset   Hypertension Mother    Diabetes Mother    Transient ischemic attack Mother    Congestive Heart Failure Mother    Dementia  Mother    CVA Father    Emphysema Father    Alcohol abuse Father    Drug abuse Father    Hypertension Father    Hypertension Sister    Hypertension Sister    Hypertension Brother    Seizures Brother    Hypertension Brother    Breast cancer Neg Hx    Social History   Socioeconomic History   Marital status: Divorced    Spouse name: Not on file   Number of children: 1   Years of education: Not on file   Highest education level: Bachelor's degree (e.g., BA, AB, BS)  Occupational History   Occupation: retired   Occupation: part time subsitute  Tobacco Use   Smoking status: Former    Packs/day: 1.00    Years: 32.00    Pack years: 32.00    Types: Cigarettes    Quit date: 2011    Years since quitting: 12.3   Smokeless tobacco: Never   Tobacco comments:    QUIT IN 2010  Vaping Use   Vaping Use: Never used  Substance and Sexual Activity   Alcohol use: Yes    Alcohol/week: 7.0 standard drinks    Types: 7 Glasses of wine per week    Comment: 1 glass a night   Drug use: No   Sexual activity: Not on file  Other Topics Concern   Not on file  Social History Narrative   Not on file   Social Determinants of Health   Financial Resource Strain: Not on file  Food Insecurity: Not on file  Transportation Needs: Not on file  Physical Activity: Not on file  Stress: Not on file  Social Connections: Not on file    Tobacco Counseling Counseling given: Not Answered Tobacco comments: QUIT IN 2010   Clinical Intake:  Pre-visit preparation completed: Yes  Pain : No/denies pain     Nutritional Risks: None Diabetes: No  How often do you need to have someone help you when you read instructions, pamphlets, or other written materials from your doctor or pharmacy?: 1 - Never  Diabetic?no  Interpreter Needed?: No  Information entered by :: Kennedy BuckerLorrie Batina Dougan, LPN   Activities of Daily Living    08/01/2021    3:37 PM  In your present state of health, do you have any  difficulty performing the following activities:  Hearing? 0  Vision? 0  Difficulty concentrating or making decisions? 0  Walking or climbing stairs? 0  Dressing or bathing? 0  Doing errands, shopping? 0  Preparing Food and eating ? N  Using the Toilet? N  In the past six months, have you accidently leaked urine? N  Do you have problems with loss of bowel control? N  Managing your Medications? N  Managing your Finances? N  Housekeeping or managing your Housekeeping? N    Patient Care Team: Erasmo DownerBacigalupo, Angela M, MD as PCP - General (Family Medicine) Thurnell GarbeWeoloski, Mark, OD (Optometry) Kandyce RudKernodle, George W Jr., MD (Rheumatology)  Indicate any recent Medical Services you may have received from other than [facility] providers in the past year (date may be approximate).     Assessment:   This is a routine wellness examination for Avleen.  Hearing/Vision screen No results found.  Dietary issues and exercise activities discussed:     Goals Addressed   None    Depression Screen    04/12/2020    3:08 PM 02/26/2020   11:51 AM 02/25/2019   10:24 AM 02/02/2017   11:42 AM 12/21/2014    2:29 PM  PHQ 2/9 Scores  PHQ - 2 Score 0 1 1 0 0  PHQ- 9 Score  Fall Risk    08/01/2021    3:37 PM 04/12/2020    3:12 PM 02/26/2020   11:48 AM 02/25/2019   10:24 AM 02/21/2018   10:40 AM  Fall Risk   Falls in the past year? 0 0 0 0 1  Number falls in past yr: 0 0 0 0 0  Injury with Fall?  0 0 0 0  Risk for fall due to :   No Fall Risks    Follow up   Falls evaluation completed      FALL RISK PREVENTION PERTAINING TO THE HOME:  Any stairs in or around the home? Yes  If so, are there any without handrails? No  Home free of loose throw rugs in walkways, pet beds, electrical cords, etc? Yes  Adequate lighting in your home to reduce risk of falls? Yes   ASSISTIVE DEVICES UTILIZED TO PREVENT FALLS:  Life alert? No  Use of a cane, walker or w/c? No  Grab bars in the bathroom? No  Shower  chair or bench in shower? No  Elevated toilet seat or a handicapped toilet? Yes    Cognitive Function:0 points on 6 CIT        02/21/2018   10:20 AM  6CIT Screen  What Year? 0 points  What month? 0 points  What time? 0 points  Count back from 20 0 points  Months in reverse 0 points  Repeat phrase 2 points  Total Score 2 points    Immunizations Immunization History  Administered Date(s) Administered   Influenza Inj Mdck Quad Pf 01/08/2017   Influenza Split 02/01/2011   Influenza, High Dose Seasonal PF 01/22/2018, 12/20/2018   Influenza,inj,Quad PF,6+ Mos 12/21/2014   Influenza-Unspecified 12/20/2018, 12/28/2019, 12/01/2020   PFIZER(Purple Top)SARS-COV-2 Vaccination 04/11/2019, 05/02/2019, 01/14/2020   Pfizer Covid-19 Vaccine Bivalent Booster 21yrs & up 06/07/2021   Pneumococcal Conjugate-13 08/22/2017   Pneumococcal Polysaccharide-23 02/25/2019   Td 06/07/2021   Tdap 10/18/2009   Zoster Recombinat (Shingrix) 04/08/2020, 07/22/2020   Zoster, Live 04/24/2013    TDAP status: Due, Education has been provided regarding the importance of this vaccine. Advised may receive this vaccine at local pharmacy or Health Dept. Aware to provide a copy of the vaccination record if obtained from local pharmacy or Health Dept. Verbalized acceptance and understanding.  Flu Vaccine status: Up to date  Pneumococcal vaccine status: Up to date  Covid-19 vaccine status: Completed vaccines  Qualifies for Shingles Vaccine? Yes   Zostavax completed Yes   Shingrix Completed?: Yes  Screening Tests Health Maintenance  Topic Date Due   INFLUENZA VACCINE  10/18/2021   DEXA SCAN  04/19/2022   MAMMOGRAM  11/09/2022   COLONOSCOPY (Pts 45-14yrs Insurance coverage will need to be confirmed)  11/12/2024   TETANUS/TDAP  06/08/2031   Pneumonia Vaccine  71+ Years old  Completed   COVID-19 Vaccine  Completed   Hepatitis C Screening  Completed   Zoster Vaccines- Shingrix  Completed   HPV VACCINES  Aged  Out    Health Maintenance  There are no preventive care reminders to display for this patient.  Colorectal cancer screening: Type of screening: Colonoscopy. Completed 11/13/19. Repeat every 5 years  Mammogram status: Completed 11/08/20. Repeat every year  Bone Density status: Completed 04/19/20. Results reflect: Bone density results: OSTEOPOROSIS. Repeat every 2 years.  Lung Cancer Screening: (Low Dose CT Chest recommended if Age 43-80 years, 30 pack-year currently smoking OR have quit w/in 15years.) does not qualify.   Additional Screening:  Hepatitis C Screening: does qualify; Completed 12/22/14  Vision Screening: Recommended annual ophthalmology exams for early detection of glaucoma and other disorders of the eye. Is the patient up to date with their annual eye exam?  Yes  Who is the provider or what is the name of the office in which the patient attends annual eye exams? Dr.Woodard If pt is not established with a provider, would they like to be referred to a provider to establish care? No .   Dental Screening: Recommended annual dental exams for proper oral hygiene  Community Resource Referral / Chronic Care Management: CRR required this visit?  No   CCM required this visit?  No      Plan:     I have personally reviewed and noted the following in the patient's chart:   Medical and social history Use of alcohol, tobacco or illicit drugs  Current medications and supplements including opioid prescriptions.  Functional ability and status Nutritional status Physical activity Advanced directives List of other physicians Hospitalizations, surgeries, and ER visits in previous 12 months Vitals Screenings to include cognitive, depression, and falls Referrals and appointments  In addition, I have reviewed and discussed with patient certain preventive protocols, quality metrics, and best practice recommendations. A written personalized care plan for preventive services as well  as general preventive health recommendations were provided to patient.     Hal Hope, LPN   9/62/9528   Nurse Notes: none

## 2021-09-08 NOTE — Progress Notes (Unsigned)
Established patient visit   Patient: Courtney Rodgers   DOB: 30-Sep-1950   71 y.o. Female  MRN: [id number] Visit Date: 09/09/2021  Today's healthcare provider: Shirlee Latch, MD   No chief complaint on file.  Subjective    HPI  Hypertension, follow-up  BP Readings from Last 3 Encounters:  05/23/21 (!) 152/81  03/10/21 138/70  02/26/20 120/65   Wt Readings from Last 3 Encounters:  08/02/21 156 lb (70.8 kg)  05/23/21 156 lb 4.8 oz (70.9 kg)  03/10/21 160 lb 12.8 oz (72.9 kg)     She was last seen for hypertension 6 months ago.  BP at that visit was 120/65 Management since that visit includes Continue current medications. She reports {excellent/good/fair/poor:19665} compliance with treatment. She {is/is not:9024} having side effects. {document side effects if present:1} She {is/is not:9024} exercising. She {is/is not:9024} adherent to low salt diet.   Outside blood pressures are {enter patient reported home BP, or 'not being checked':1}.  She {does/does not:200015} smoke.  --------------------------------------------------------------------------------------------------- Lipid/Cholesterol, follow-up  Last Lipid Panel: Lab Results  Component Value Date   CHOL 201 (H) 02/26/2020   LDLCALC 87 02/26/2020   HDL 94 02/26/2020   TRIG 116 02/26/2020    She was last seen for this 6 months ago.  Management since that visit includes continue current medication.  She reports {excellent/good/fair/poor:19665} compliance with treatment. She {is/is not:9024} having side effects. {document side effects if present:1}  Symptoms: {Yes/No:20286} appetite changes {Yes/No:20286} foot ulcerations  {Yes/No:20286} chest pain {Yes/No:20286} chest pressure/discomfort  {Yes/No:20286} dyspnea {Yes/No:20286} orthopnea  {Yes/No:20286} fatigue {Yes/No:20286} lower extremity edema  {Yes/No:20286} palpitations {Yes/No:20286} paroxysmal nocturnal dyspnea  {Yes/No:20286} nausea  {Yes/No:20286} numbness or tingling of extremity  {Yes/No:20286} polydipsia {Yes/No:20286} polyuria  {Yes/No:20286} speech difficulty {Yes/No:20286} syncope   She is following a {diet:21022986} diet. Current exercise: {exercise types:16438}  Last metabolic panel Lab Results  Component Value Date   GLUCOSE 112 (H) 02/26/2020   NA 137 02/26/2020   K 3.4 (L) 02/26/2020   BUN 15 02/26/2020   CREATININE 0.68 02/26/2020   GFRNONAA 91 02/26/2020   CALCIUM 8.9 02/26/2020   AST 20 02/26/2020   ALT 15 02/26/2020   The 10-year ASCVD risk score (Arnett DK, et al., 2019) is: 11.4%  ---------------------------------------------------------------------------------------------------   Medications: Outpatient Medications Prior to Visit  Medication Sig   alendronate (FOSAMAX) 70 MG tablet TAKE 1 TABLET (70 MG TOTAL) BY MOUTH EVERY 7 DAYS WITH FULL GLASS WATER ON EMPTY STOMACH   amLODipine (NORVASC) 5 MG tablet Take 1 tablet (5 mg total) by mouth daily.   aspirin 81 MG chewable tablet Chew by mouth.   atorvastatin (LIPITOR) 10 MG tablet Take 1 tablet (10 mg total) by mouth daily.   azelastine (OPTIVAR) 0.05 % ophthalmic solution    Biotin 5000 MCG CAPS 1 capsule daily.   Calcium Carbonate-Vit D-Min (CALCIUM 1200 PO) 1 tablet daily.   carvedilol (COREG) 12.5 MG tablet Take 12.5 mg by mouth 2 (two) times daily with a meal.   COMBIGAN 0.2-0.5 % ophthalmic solution 1 DROP IN BOTH EYES TWICE A DAY   folic acid (FOLVITE) 800 MCG tablet 1 tablet daily.   latanoprost (XALATAN) 0.005 % ophthalmic solution Place 1 drop into both eyes at bedtime.   methotrexate 2.5 MG tablet 6 tablets once a week.   Multiple Vitamin (MULTI-VITAMIN) tablet Take 1 tablet by mouth daily.   triamterene-hydrochlorothiazide (MAXZIDE-25) 37.5-25 MG tablet Take 1 tablet by mouth daily.   valACYclovir (VALTREX) 1000 MG tablet  Take 1 tablet (1,000 mg total) by mouth daily.   No facility-administered medications prior to visit.     Review of Systems  {Labs  Heme  Chem  Endocrine  Serology  Results Review (optional):23779}   Objective    There were no vitals taken for this visit. {Show previous vital signs (optional):23777}  Physical Exam  ***  No results found for any visits on 09/09/21.  Assessment & Plan     ***  No follow-ups on file.      {provider attestation***:1}   Shirlee Latch, MD  [facility] [phone] (phone) [phone] (fax)  [facility]

## 2021-09-09 ENCOUNTER — Encounter: Payer: Self-pay | Admitting: Family Medicine

## 2021-09-09 ENCOUNTER — Ambulatory Visit: Payer: Medicare PPO | Admitting: Family Medicine

## 2021-09-09 VITALS — BP 130/68 | HR 64 | Temp 97.6°F | Resp 16 | Wt 142.9 lb

## 2021-09-09 DIAGNOSIS — M8580 Other specified disorders of bone density and structure, unspecified site: Secondary | ICD-10-CM

## 2021-09-09 DIAGNOSIS — I10 Essential (primary) hypertension: Secondary | ICD-10-CM | POA: Diagnosis not present

## 2021-09-09 DIAGNOSIS — R0989 Other specified symptoms and signs involving the circulatory and respiratory systems: Secondary | ICD-10-CM | POA: Diagnosis not present

## 2021-09-09 DIAGNOSIS — E7800 Pure hypercholesterolemia, unspecified: Secondary | ICD-10-CM | POA: Diagnosis not present

## 2021-09-09 DIAGNOSIS — I480 Paroxysmal atrial fibrillation: Secondary | ICD-10-CM

## 2021-09-09 DIAGNOSIS — M069 Rheumatoid arthritis, unspecified: Secondary | ICD-10-CM | POA: Diagnosis not present

## 2021-09-09 DIAGNOSIS — M810 Age-related osteoporosis without current pathological fracture: Secondary | ICD-10-CM

## 2021-09-09 MED ORDER — ALENDRONATE SODIUM 70 MG PO TABS: 70.0000 mg | ORAL_TABLET | ORAL | 3 refills | Status: DC

## 2021-09-09 NOTE — Assessment & Plan Note (Signed)
Chronic and stable In NSR today Continue rate control

## 2021-09-10 LAB — COMPREHENSIVE METABOLIC PANEL
ALT: 45 IU/L — ABNORMAL HIGH (ref 0–32)
AST: 37 IU/L (ref 0–40)
Albumin/Globulin Ratio: 1.9 (ref 1.2–2.2)
Albumin: 5.1 g/dL — ABNORMAL HIGH (ref 3.8–4.8)
Alkaline Phosphatase: 62 IU/L (ref 44–121)
BUN/Creatinine Ratio: 23 (ref 12–28)
BUN: 14 mg/dL (ref 8–27)
Bilirubin Total: 0.6 mg/dL (ref 0.0–1.2)
CO2: 23 mmol/L (ref 20–29)
Calcium: 9.8 mg/dL (ref 8.7–10.3)
Chloride: 97 mmol/L (ref 96–106)
Creatinine, Ser: 0.62 mg/dL (ref 0.57–1.00)
Globulin, Total: 2.7 g/dL (ref 1.5–4.5)
Glucose: 74 mg/dL (ref 70–99)
Potassium: 3.6 mmol/L (ref 3.5–5.2)
Sodium: 138 mmol/L (ref 134–144)
Total Protein: 7.8 g/dL (ref 6.0–8.5)
eGFR: 96 mL/min/{1.73_m2} (ref >59)

## 2021-09-10 LAB — LIPID PANEL
Chol/HDL Ratio: 1.8 ratio (ref 0.0–4.4)
Cholesterol, Total: 249 mg/dL — ABNORMAL HIGH (ref 100–199)
HDL: 137 mg/dL (ref >39)
LDL Chol Calc (NIH): 97 mg/dL (ref 0–99)
Triglycerides: 95 mg/dL (ref 0–149)
VLDL Cholesterol Cal: 15 mg/dL (ref 5–40)

## 2021-09-13 DIAGNOSIS — R0989 Other specified symptoms and signs involving the circulatory and respiratory systems: Secondary | ICD-10-CM | POA: Diagnosis not present

## 2021-09-15 DIAGNOSIS — H26491 Other secondary cataract, right eye: Secondary | ICD-10-CM | POA: Diagnosis not present

## 2021-09-15 DIAGNOSIS — H2512 Age-related nuclear cataract, left eye: Secondary | ICD-10-CM | POA: Diagnosis not present

## 2021-09-15 DIAGNOSIS — H401232 Low-tension glaucoma, bilateral, moderate stage: Secondary | ICD-10-CM | POA: Diagnosis not present

## 2021-09-15 DIAGNOSIS — H35371 Puckering of macula, right eye: Secondary | ICD-10-CM | POA: Diagnosis not present

## 2021-09-15 DIAGNOSIS — H04123 Dry eye syndrome of bilateral lacrimal glands: Secondary | ICD-10-CM | POA: Diagnosis not present

## 2021-10-25 DIAGNOSIS — R0989 Other specified symptoms and signs involving the circulatory and respiratory systems: Secondary | ICD-10-CM | POA: Diagnosis not present

## 2021-10-27 ENCOUNTER — Other Ambulatory Visit: Payer: Self-pay | Admitting: Family Medicine

## 2021-10-27 DIAGNOSIS — Z1231 Encounter for screening mammogram for malignant neoplasm of breast: Secondary | ICD-10-CM

## 2021-11-08 ENCOUNTER — Telehealth: Payer: Self-pay

## 2021-11-08 NOTE — Telephone Encounter (Signed)
Copied from CRM [id number]. Topic: General - Other >> Nov 08, 2021 11:28 AM Franchot Heidelberg wrote: Reason for CRM: Pt wants to have an X-Ray of underneath her breast area, her rib area (upper)   Best contact: [phone]

## 2021-11-08 NOTE — Telephone Encounter (Signed)
Patient advised and has some other medical appointments and will call back to make appointment

## 2021-11-09 DIAGNOSIS — R0781 Pleurodynia: Secondary | ICD-10-CM | POA: Diagnosis not present

## 2021-11-09 DIAGNOSIS — M0579 Rheumatoid arthritis with rheumatoid factor of multiple sites without organ or systems involvement: Secondary | ICD-10-CM | POA: Diagnosis not present

## 2021-11-09 DIAGNOSIS — I7300 Raynaud's syndrome without gangrene: Secondary | ICD-10-CM | POA: Diagnosis not present

## 2021-11-09 DIAGNOSIS — Z0389 Encounter for observation for other suspected diseases and conditions ruled out: Secondary | ICD-10-CM | POA: Diagnosis not present

## 2021-11-09 DIAGNOSIS — Z79899 Other long term (current) drug therapy: Secondary | ICD-10-CM | POA: Diagnosis not present

## 2021-11-15 ENCOUNTER — Ambulatory Visit
Admission: RE | Admit: 2021-11-15 | Discharge: 2021-11-15 | Disposition: A | Discharge status: Home / Self Care | Payer: Medicare PPO | Source: Ambulatory Visit | Attending: Family Medicine | Admitting: Family Medicine

## 2021-11-15 DIAGNOSIS — Z1231 Encounter for screening mammogram for malignant neoplasm of breast: Secondary | ICD-10-CM | POA: Insufficient documentation

## 2021-11-17 NOTE — Progress Notes (Signed)
Hi Courtney Rodgers  Normal mammogram; repeat in 1 year.  Please let us know if you have any questions.  Thank you,  Merita Norton, FNP

## 2021-12-05 DIAGNOSIS — H35371 Puckering of macula, right eye: Secondary | ICD-10-CM | POA: Diagnosis not present

## 2021-12-05 DIAGNOSIS — H04123 Dry eye syndrome of bilateral lacrimal glands: Secondary | ICD-10-CM | POA: Diagnosis not present

## 2021-12-05 DIAGNOSIS — H401213 Low-tension glaucoma, right eye, severe stage: Secondary | ICD-10-CM | POA: Diagnosis not present

## 2021-12-05 DIAGNOSIS — H401222 Low-tension glaucoma, left eye, moderate stage: Secondary | ICD-10-CM | POA: Diagnosis not present

## 2021-12-05 DIAGNOSIS — H2512 Age-related nuclear cataract, left eye: Secondary | ICD-10-CM | POA: Diagnosis not present

## 2022-03-08 ENCOUNTER — Other Ambulatory Visit: Payer: Self-pay | Admitting: Family Medicine

## 2022-03-08 DIAGNOSIS — I10 Essential (primary) hypertension: Secondary | ICD-10-CM

## 2022-03-08 DIAGNOSIS — I7300 Raynaud's syndrome without gangrene: Secondary | ICD-10-CM

## 2022-03-08 NOTE — Telephone Encounter (Signed)
Medication Refill - Medication: alendronate (FOSAMAX) 70 MG tablet   Has the patient contacted their pharmacy? Yes.   Pt told to contact provider  Preferred Pharmacy (with phone number or street name):  [id number] - [place], [place] [address] Phone: [phone]  Fax: [phone]     Has the patient been seen for an appointment in the last year OR does the patient have an upcoming appointment? Yes.    Agent: Please be advised that RX refills may take up to 3 business days. We ask that you follow-up with your pharmacy.

## 2022-03-16 ENCOUNTER — Other Ambulatory Visit: Payer: Self-pay | Admitting: Family Medicine

## 2022-03-16 DIAGNOSIS — E7800 Pure hypercholesterolemia, unspecified: Secondary | ICD-10-CM

## 2022-03-21 DIAGNOSIS — Z79899 Other long term (current) drug therapy: Secondary | ICD-10-CM | POA: Diagnosis not present

## 2022-03-21 DIAGNOSIS — M810 Age-related osteoporosis without current pathological fracture: Secondary | ICD-10-CM | POA: Diagnosis not present

## 2022-03-21 DIAGNOSIS — I7300 Raynaud's syndrome without gangrene: Secondary | ICD-10-CM | POA: Diagnosis not present

## 2022-03-21 DIAGNOSIS — M0579 Rheumatoid arthritis with rheumatoid factor of multiple sites without organ or systems involvement: Secondary | ICD-10-CM | POA: Diagnosis not present

## 2022-03-27 ENCOUNTER — Ambulatory Visit: Payer: Medicare PPO | Admitting: Family Medicine

## 2022-03-28 DIAGNOSIS — M8588 Other specified disorders of bone density and structure, other site: Secondary | ICD-10-CM | POA: Diagnosis not present

## 2022-03-31 ENCOUNTER — Other Ambulatory Visit: Payer: Self-pay | Admitting: Family Medicine

## 2022-03-31 DIAGNOSIS — B009 Herpesviral infection, unspecified: Secondary | ICD-10-CM

## 2022-03-31 NOTE — Telephone Encounter (Signed)
Requested Prescriptions  Pending Prescriptions Disp Refills   valACYclovir (VALTREX) 1000 MG tablet [Pharmacy Med Name: VALACYCLOVIR HCL 1 GRAM TABLET] 90 tablet 0    Sig: TAKE 1 TABLET BY MOUTH EVERY DAY     Antimicrobials:  Antiviral Agents - Anti-Herpetic Passed - 03/31/2022  1:41 AM      Passed - Valid encounter within last 12 months    Recent Outpatient Visits           6 months ago Essential (primary) hypertension   [employer], Dionne Bucy, MD   10 months ago Foreign body in mouth, initial encounter   [employer], [place], PA-C   1 year ago Annual physical exam   [employer], Dionne Bucy, MD   2 years ago Annual physical exam   [facility] [place], [place]   3 years ago Annual physical exam   [facility] [place], [place]       Future Appointments             In 6 days Bacigalupo, Dionne Bucy, MD [facility], [place]

## 2022-04-05 NOTE — Progress Notes (Signed)
I,Sulibeya S Dimas,acting as a Education administrator for Lavon Paganini, MD.,have documented all relevant documentation on the behalf of Lavon Paganini, MD,as directed by  Lavon Paganini, MD while in the presence of Lavon Paganini, MD.     Established patient visit   Patient: Courtney Rodgers   DOB: 1950/03/28   72 y.o. Female  MRN: [id number] Visit Date: 04/06/2022  Today's healthcare provider: Lavon Paganini, MD   Chief Complaint  Patient presents with   Hypertension   Hyperlipidemia   Subjective    HPI  Hypertension, follow-up  BP Readings from Last 3 Encounters:  04/06/22 124/80  09/09/21 130/68  05/23/21 (!) 152/81   Wt Readings from Last 3 Encounters:  04/06/22 143 lb 1.6 oz (64.9 kg)  09/09/21 142 lb 14.4 oz (64.8 kg)  08/02/21 156 lb (70.8 kg)     She was last seen for hypertension 6 months ago.  BP at that visit was 130/68. Management since that visit includes no changes. She reports excellent compliance with treatment. She is not having side effects.   Outside blood pressures are stable 120s-130s/80s.  --------------------------------------------------------------------------------------------------- Lipid/Cholesterol, follow-up  Last Lipid Panel: Lab Results  Component Value Date   CHOL 249 (H) 09/09/2021   LDLCALC 97 09/09/2021   HDL 137 09/09/2021   TRIG 95 09/09/2021    She was last seen for this 6 months ago.  Management since that visit includes no changes.  She reports excellent compliance with treatment. She is not having side effects.   Last metabolic panel Lab Results  Component Value Date   GLUCOSE 74 09/09/2021   NA 138 09/09/2021   K 3.6 09/09/2021   BUN 14 09/09/2021   CREATININE 0.62 09/09/2021   EGFR 96 09/09/2021   GFRNONAA 91 02/26/2020   CALCIUM 9.8 09/09/2021   AST 37 09/09/2021   ALT 45 (H) 09/09/2021   The ASCVD Risk score (Arnett DK, et al., 2019) failed to calculate for the following reasons:   The valid HDL  cholesterol range is 20 to 100 mg/dL  ---------------------------------------------------------------------------------------------------   Medications: Outpatient Medications Prior to Visit  Medication Sig   alendronate (FOSAMAX) 70 MG tablet Take 1 tablet (70 mg total) by mouth once a week. Take with a full glass of water on an empty stomach.   amLODipine (NORVASC) 5 MG tablet TAKE 1 TABLET (5 MG TOTAL) BY MOUTH DAILY.   atorvastatin (LIPITOR) 10 MG tablet TAKE 1 TABLET BY MOUTH EVERY DAY   azelastine (OPTIVAR) 0.05 % ophthalmic solution    Biotin 5000 MCG CAPS 1 capsule daily.   Calcium Carbonate-Vit D-Min (CALCIUM 1200 PO) 1 tablet daily.   carvedilol (COREG) 12.5 MG tablet Take 12.5 mg by mouth 2 (two) times daily with a meal.   COMBIGAN 0.2-0.5 % ophthalmic solution 1 DROP IN BOTH EYES TWICE A DAY   folic acid (FOLVITE) 993 MCG tablet 1 tablet daily.   latanoprost (XALATAN) 0.005 % ophthalmic solution Place 1 drop into both eyes at bedtime.   methotrexate 2.5 MG tablet 6 tablets once a week.   Multiple Vitamin (MULTI-VITAMIN) tablet Take 1 tablet by mouth daily.   triamterene-hydrochlorothiazide (MAXZIDE-25) 37.5-25 MG tablet Take 1 tablet by mouth daily.   valACYclovir (VALTREX) 1000 MG tablet TAKE 1 TABLET BY MOUTH EVERY DAY   No facility-administered medications prior to visit.    Review of Systems  Eyes:  Negative for visual disturbance.  Respiratory:  Negative for chest tightness and shortness of breath.   Cardiovascular:  Negative for  chest pain and leg swelling.  Neurological:  Negative for dizziness, light-headedness and headaches.       Objective    BP 124/80 Comment: home reading  Pulse 81   Temp 97.8 F (36.6 C) (Temporal)   Resp 16   Ht 5\' 4"  (1.626 m)   Wt 143 lb 1.6 oz (64.9 kg)   BMI 24.56 kg/m  BP Readings from Last 3 Encounters:  04/06/22 124/80  09/09/21 130/68  05/23/21 (!) 152/81   Wt Readings from Last 3 Encounters:  04/06/22 143 lb 1.6 oz  (64.9 kg)  09/09/21 142 lb 14.4 oz (64.8 kg)  08/02/21 156 lb (70.8 kg)      Physical Exam Vitals reviewed.  Constitutional:      General: She is not in acute distress.    Appearance: Normal appearance. She is well-developed. She is not diaphoretic.  HENT:     Head: Normocephalic and atraumatic.  Eyes:     General: No scleral icterus.    Conjunctiva/sclera: Conjunctivae normal.  Neck:     Thyroid: No thyromegaly.  Cardiovascular:     Rate and Rhythm: Normal rate and regular rhythm.     Heart sounds: Normal heart sounds. No murmur heard. Pulmonary:     Effort: Pulmonary effort is normal. No respiratory distress.     Breath sounds: Normal breath sounds. No wheezing, rhonchi or rales.  Musculoskeletal:     Cervical back: Neck supple.     Right lower leg: No edema.     Left lower leg: No edema.  Lymphadenopathy:     Cervical: No cervical adenopathy.  Skin:    General: Skin is warm and dry.     Findings: No rash.  Neurological:     Mental Status: She is alert and oriented to person, place, and time. Mental status is at baseline.  Psychiatric:        Mood and Affect: Mood normal.        Behavior: Behavior normal.       No results found for any visits on 04/06/22.  Assessment & Plan     Problem List Items Addressed This Visit       Cardiovascular and Mediastinum   Essential (primary) hypertension - Primary    Well controlled Continue current medications Recheck metabolic panel F/u in 6 months       Relevant Orders   Basic Metabolic Panel (BMET)   Comprehensive metabolic panel     Other   Hypercholesteremia    Previously well controlled Continue statin Repeat FLP      Relevant Orders   Lipid panel   Comprehensive metabolic panel   Lipid panel   Hyperglycemia    Recommend low carb diet Recheck A1c beforen ext visit      Relevant Orders   Hemoglobin A1c     Return in about 6 months (around 10/05/2022) for CPE.      I, Lavon Paganini, MD,  have reviewed all documentation for this visit. The documentation on 04/06/22 for the exam, diagnosis, procedures, and orders are all accurate and complete.   Latina Frank, Dionne Bucy, MD, MPH [facility]

## 2022-04-06 ENCOUNTER — Ambulatory Visit: Payer: Medicare PPO | Admitting: Family Medicine

## 2022-04-06 ENCOUNTER — Encounter: Payer: Self-pay | Admitting: Family Medicine

## 2022-04-06 VITALS — BP 124/80 | HR 81 | Temp 97.8°F | Resp 16 | Ht 64.0 in | Wt 143.1 lb

## 2022-04-06 DIAGNOSIS — R739 Hyperglycemia, unspecified: Secondary | ICD-10-CM | POA: Diagnosis not present

## 2022-04-06 DIAGNOSIS — E7800 Pure hypercholesterolemia, unspecified: Secondary | ICD-10-CM

## 2022-04-06 DIAGNOSIS — I10 Essential (primary) hypertension: Secondary | ICD-10-CM | POA: Diagnosis not present

## 2022-04-06 NOTE — Assessment & Plan Note (Signed)
Well controlled Continue current medications Recheck metabolic panel F/u in 6 months  

## 2022-04-06 NOTE — Assessment & Plan Note (Signed)
Previously well controlled Continue statin Repeat FLP

## 2022-04-06 NOTE — Assessment & Plan Note (Signed)
Recommend low carb diet Recheck A1c beforen ext visit

## 2022-04-07 LAB — BASIC METABOLIC PANEL
BUN/Creatinine Ratio: 34 — ABNORMAL HIGH (ref 12–28)
BUN: 21 mg/dL (ref 8–27)
CO2: 26 mmol/L (ref 20–29)
Calcium: 10.5 mg/dL — ABNORMAL HIGH (ref 8.7–10.3)
Chloride: 101 mmol/L (ref 96–106)
Creatinine, Ser: 0.61 mg/dL (ref 0.57–1.00)
Glucose: 93 mg/dL (ref 70–99)
Potassium: 5 mmol/L (ref 3.5–5.2)
Sodium: 144 mmol/L (ref 134–144)
eGFR: 97 mL/min/{1.73_m2} (ref >59)

## 2022-04-07 LAB — LIPID PANEL
Chol/HDL Ratio: 1.9 ratio (ref 0.0–4.4)
Cholesterol, Total: 249 mg/dL — ABNORMAL HIGH (ref 100–199)
HDL: 130 mg/dL (ref >39)
LDL Chol Calc (NIH): 108 mg/dL — ABNORMAL HIGH (ref 0–99)
Triglycerides: 67 mg/dL (ref 0–149)
VLDL Cholesterol Cal: 11 mg/dL (ref 5–40)

## 2022-04-10 DIAGNOSIS — H35371 Puckering of macula, right eye: Secondary | ICD-10-CM | POA: Diagnosis not present

## 2022-04-10 DIAGNOSIS — H401222 Low-tension glaucoma, left eye, moderate stage: Secondary | ICD-10-CM | POA: Diagnosis not present

## 2022-04-10 DIAGNOSIS — H2512 Age-related nuclear cataract, left eye: Secondary | ICD-10-CM | POA: Diagnosis not present

## 2022-04-10 DIAGNOSIS — H401213 Low-tension glaucoma, right eye, severe stage: Secondary | ICD-10-CM | POA: Diagnosis not present

## 2022-04-10 DIAGNOSIS — H04123 Dry eye syndrome of bilateral lacrimal glands: Secondary | ICD-10-CM | POA: Diagnosis not present

## 2022-04-21 ENCOUNTER — Other Ambulatory Visit: Payer: Self-pay | Admitting: Family Medicine

## 2022-04-21 DIAGNOSIS — I10 Essential (primary) hypertension: Secondary | ICD-10-CM

## 2022-05-17 ENCOUNTER — Ambulatory Visit
Admission: RE | Admit: 2022-05-17 | Discharge: 2022-05-17 | Disposition: A | Discharge status: Home / Self Care | Payer: Medicare PPO | Attending: Physician Assistant | Admitting: Physician Assistant

## 2022-05-17 ENCOUNTER — Ambulatory Visit: Payer: Self-pay

## 2022-05-17 ENCOUNTER — Ambulatory Visit
Admission: RE | Admit: 2022-05-17 | Discharge: 2022-05-17 | Disposition: A | Discharge status: Home / Self Care | Payer: Medicare PPO | Source: Ambulatory Visit | Attending: Physician Assistant | Admitting: Physician Assistant

## 2022-05-17 ENCOUNTER — Ambulatory Visit: Payer: Medicare PPO | Admitting: Physician Assistant

## 2022-05-17 ENCOUNTER — Encounter: Payer: Self-pay | Admitting: Physician Assistant

## 2022-05-17 VITALS — BP 145/72 | HR 63 | Wt 143.3 lb

## 2022-05-17 DIAGNOSIS — M79675 Pain in left toe(s): Secondary | ICD-10-CM

## 2022-05-17 DIAGNOSIS — M7989 Other specified soft tissue disorders: Secondary | ICD-10-CM | POA: Diagnosis not present

## 2022-05-17 NOTE — Telephone Encounter (Signed)
     Chief Complaint: Left pain and swelling Symptoms: Above Frequency: Monday Pertinent Negatives: Patient denies fever Disposition: []$ ED /[]$ Urgent Care (no appt availability in office) / [x]$ Appointment(In office/virtual)/ []$  [facility]/ []$ Home Care/ []$ Refused Recommended Disposition /[]$ [facility]/ []$  Follow-up with PCP Additional Notes:   Reason for Disposition  [1] Swollen foot AND [2] no fever  (Exceptions: localized bump from bunions, calluses, insect bite, sting)  Answer Assessment - Initial Assessment Questions 1. ONSET: "When did the pain start?"      Monday 2. LONoCATION: "Where is the pain located?"      Left foot at [facility] toe 3. PAIN: "How bad is the pain?"    (Scale 1-10; or mild, moderate, severe)  - MILD (1-3): doesn't interfere with normal activities.   - MODERATE (4-7): interferes with normal activities (e.g., work or school) or awakens from sleep, limping.   - SEVERE (8-10): excruciating pain, unable to do any normal activities, unable to walk.      Now - 9 4. WORK OR EXERCISE: "Has there been any recent work or exercise that involved this part of the body?"      No 5. CAUSE: "What do you think is causing the foot pain?"     Unsure 6. OTHER SYMPTOMS: "Do you have any other symptoms?" (e.g., leg pain, rash, fever, numbness)     No 7. PREGNANCY: "Is there any chance you are pregnant?" "When was your last menstrual period?"     No  Protocols used: Foot Pain-A-AH

## 2022-05-17 NOTE — Progress Notes (Signed)
I,[name],acting as a Education administrator for [employer], PA-C.,have documented all relevant documentation on the behalf of Courtney Speak, PA-C,as directed by  [employer], PA-C while in the presence of [employer], PA-C.   Established patient visit   Patient: Courtney Rodgers   DOB: 16-May-1950   72 y.o. Female  MRN: [id number] Visit Date: 05/17/2022  Today's healthcare provider: [name], PA-C   CC: leg foot pain and swelling  Subjective    HPI  Patient is present and being seen for left foot and great toe pain associated with edema starting Monday night. Patient reports her pain is now at a 9. States no recent work or exercise injury but not sure of the cause. Also reports it looks a little red or possibly bruised. Patient reports that the pain and swelling is getting worse since starting. No changes in lifestyle. Reports taking ibuprofen X 2 about 6 am and also rubbed pain ointment on it.  ---------------------------------------------------------------------------------------------------   Medications: Outpatient Medications Prior to Visit  Medication Sig   alendronate (FOSAMAX) 70 MG tablet Take 1 tablet (70 mg total) by mouth once a week. Take with a full glass of water on an empty stomach.   amLODipine (NORVASC) 5 MG tablet TAKE 1 TABLET (5 MG TOTAL) BY MOUTH DAILY.   atorvastatin (LIPITOR) 10 MG tablet TAKE 1 TABLET BY MOUTH EVERY DAY   azelastine (OPTIVAR) 0.05 % ophthalmic solution    Biotin 5000 MCG CAPS 1 capsule daily.   Calcium Carbonate-Vit D-Min (CALCIUM 1200 PO) 1 tablet daily.   carvedilol (COREG) 12.5 MG tablet Take 12.5 mg by mouth 2 (two) times daily with a meal.   COMBIGAN 0.2-0.5 % ophthalmic solution 1 DROP IN BOTH EYES TWICE A DAY   folic acid (FOLVITE) Q000111Q MCG tablet 1 tablet daily.   latanoprost (XALATAN) 0.005 % ophthalmic solution Place 1 drop into both eyes at bedtime.   methotrexate 2.5 MG tablet 6 tablets once a week.   Multiple Vitamin  (MULTI-VITAMIN) tablet Take 1 tablet by mouth daily.   triamterene-hydrochlorothiazide (MAXZIDE-25) 37.5-25 MG tablet TAKE 1 TABLET BY MOUTH EVERY DAY   valACYclovir (VALTREX) 1000 MG tablet TAKE 1 TABLET BY MOUTH EVERY DAY   No facility-administered medications prior to visit.    Review of Systems  All other systems reviewed and are negative. Except see hpi     Objective    There were no vitals taken for this visit.   Physical Exam Vitals reviewed.  Constitutional:      General: She is not in acute distress.    Appearance: Normal appearance. She is well-developed. She is not diaphoretic.  HENT:     Head: Normocephalic and atraumatic.  Eyes:     General: No scleral icterus.    Conjunctiva/sclera: Conjunctivae normal.  Neck:     Thyroid: No thyromegaly.  Cardiovascular:     Rate and Rhythm: Normal rate and regular rhythm.     Pulses: Normal pulses.     Heart sounds: Normal heart sounds. No murmur heard. Pulmonary:     Effort: Pulmonary effort is normal. No respiratory distress.     Breath sounds: Normal breath sounds. No wheezing, rhonchi or rales.  Musculoskeletal:        General: Swelling (mild-moderated, left great toe) and tenderness (left great toe) present.     Cervical back: Neck supple.     Right lower leg: No edema.     Left lower leg: No edema.  Lymphadenopathy:  Cervical: No cervical adenopathy.  Skin:    General: Skin is warm and dry.     Findings: Erythema (mild, left great toe) present. No rash.  Neurological:     Mental Status: She is alert and oriented to person, place, and time. Mental status is at baseline.  Psychiatric:        Mood and Affect: Mood normal.        Behavior: Behavior normal.        Thought Content: Thought content normal.        Judgment: Judgment normal.      No results found for any visits on 05/17/22.  Assessment & Plan     1. Great toe pain, left X 3 days Acute, new problem In the setting of rheumatoid  arthritis And long-term use of methotrexate/ 15 years No clear trigger event , could be overuse during her recent exercise at gym - Uric acid - CBC w/Diff/Platelet - Sed Rate (ESR) - DG Foot Complete Left; Future Will reassess after receiving the results   No follow-ups on file.      The patient was advised to call back or seek an in-person evaluation if the symptoms worsen or if the condition fails to improve as anticipated.  I discussed the assessment and treatment plan with the patient. The patient was provided an opportunity to ask questions and all were answered. The patient agreed with the plan and demonstrated an understanding of the instructions.  I, Courtney Speak, PA-C have reviewed all documentation for this visit. The documentation on 05/17/22 for the exam, diagnosis, procedures, and orders are all accurate and complete.  Courtney Rodgers, [facility], [place] [phone] (phone) [phone] (fax)   [place]

## 2022-05-18 LAB — CBC WITH DIFFERENTIAL/PLATELET
Basophils Absolute: 0.1 10*3/uL (ref 0.0–0.2)
Basos: 1 %
EOS (ABSOLUTE): 0.1 10*3/uL (ref 0.0–0.4)
Eos: 1 %
Hematocrit: 38.3 % (ref 34.0–46.6)
Hemoglobin: 13.3 g/dL (ref 11.1–15.9)
Immature Grans (Abs): 0 10*3/uL (ref 0.0–0.1)
Immature Granulocytes: 1 %
Lymphocytes Absolute: 1.6 10*3/uL (ref 0.7–3.1)
Lymphs: 21 %
MCH: 36 pg — ABNORMAL HIGH (ref 26.6–33.0)
MCHC: 34.7 g/dL (ref 31.5–35.7)
MCV: 104 fL — ABNORMAL HIGH (ref 79–97)
Monocytes Absolute: 1 10*3/uL — ABNORMAL HIGH (ref 0.1–0.9)
Monocytes: 13 %
Neutrophils Absolute: 4.7 10*3/uL (ref 1.4–7.0)
Neutrophils: 63 %
Platelets: 307 10*3/uL (ref 150–450)
RBC: 3.69 x10E6/uL — ABNORMAL LOW (ref 3.77–5.28)
RDW: 12.1 % (ref 11.7–15.4)
WBC: 7.4 10*3/uL (ref 3.4–10.8)

## 2022-05-18 LAB — URIC ACID: Uric Acid: 6.5 mg/dL (ref 3.0–7.2)

## 2022-05-18 LAB — SEDIMENTATION RATE: Sed Rate: 11 mm/hr (ref 0–40)

## 2022-05-18 NOTE — Progress Notes (Signed)
Please, let pt know that she has stable labs for her including negative uric acid and sed rate. We will reassess after receiving imaging results

## 2022-05-22 ENCOUNTER — Telehealth: Payer: Self-pay

## 2022-05-22 NOTE — Progress Notes (Signed)
Please, let pt know that her XR of the left foot were negative. Please ask her if she is doing better, if worse or swelling and pain persist , an antibiotic could be called in.

## 2022-05-22 NOTE — Telephone Encounter (Signed)
Copied from [place]. Topic: General - Other >> May 19, 2022  9:44 AM Everette C wrote: Reason for CRM: The patient has called to request contact with a member of clinical staff when possible to review the recent imaging of their left foot  Please contact the patient further when possible

## 2022-06-27 ENCOUNTER — Ambulatory Visit: Payer: Medicare PPO | Admitting: Physician Assistant

## 2022-06-27 VITALS — BP 147/87 | HR 72 | Temp 98.5°F | Wt 147.0 lb

## 2022-06-27 DIAGNOSIS — L03116 Cellulitis of left lower limb: Secondary | ICD-10-CM

## 2022-06-27 MED ORDER — CEPHALEXIN 500 MG PO CAPS: 500.0000 mg | ORAL_CAPSULE | Freq: Two times a day (BID) | ORAL | 0 refills | Status: DC

## 2022-06-27 MED ORDER — ALENDRONATE SODIUM 70 MG PO TABS: 70.0000 mg | ORAL_TABLET | ORAL | 3 refills | Status: DC

## 2022-06-27 NOTE — Progress Notes (Signed)
   Vivien Rota DeSanto,acting as a Neurosurgeon for [employer], PA-C.,have documented all relevant documentation on the behalf of Debera Lat, PA-C,as directed by  [employer], PA-C while in the presence of [employer], PA-C.     Established patient visit   Patient: Courtney Rodgers   DOB: [birth year]   72 y.o. Female  MRN: [id number] Visit Date: 06/27/2022  Today's healthcare provider: Debera Lat, PA-C   No chief complaint on file.  Subjective    HPI  Patient is a 72 year old female who presents for evaluation of pain and swelling of the left leg, foot and toes.  Patient states she was seen 1 month ago with the same symptoms.  She had x-rays and labs but no definitive diagnosis.  Patient's symptoms resolve without intervention until 5 days age when she developed the pain and swelling along with redness of  her great toe .  Medications: Outpatient Medications Prior to Visit  Medication Sig   alendronate (FOSAMAX) 70 MG tablet Take 1 tablet (70 mg total) by mouth once a week. Take with a full glass of water on an empty stomach.   amLODipine (NORVASC) 5 MG tablet TAKE 1 TABLET (5 MG TOTAL) BY MOUTH DAILY.   atorvastatin (LIPITOR) 10 MG tablet TAKE 1 TABLET BY MOUTH EVERY DAY   azelastine (OPTIVAR) 0.05 % ophthalmic solution    Biotin 5000 MCG CAPS 1 capsule daily.   Calcium Carbonate-Vit D-Min (CALCIUM 1200 PO) 1 tablet daily.   carvedilol (COREG) 12.5 MG tablet Take 12.5 mg by mouth 2 (two) times daily with a meal.   COMBIGAN 0.2-0.5 % ophthalmic solution 1 DROP IN BOTH EYES TWICE A DAY   folic acid (FOLVITE) 800 MCG tablet 1 tablet daily.   latanoprost (XALATAN) 0.005 % ophthalmic solution Place 1 drop into both eyes at bedtime.   methotrexate 2.5 MG tablet 6 tablets once a week.   Multiple Vitamin (MULTI-VITAMIN) tablet Take 1 tablet by mouth daily.   triamterene-hydrochlorothiazide (MAXZIDE-25) 37.5-25 MG tablet TAKE 1 TABLET BY MOUTH EVERY DAY   valACYclovir (VALTREX) 1000 MG  tablet TAKE 1 TABLET BY MOUTH EVERY DAY   No facility-administered medications prior to visit.    Review of Systems  {Labs  Heme  Chem  Endocrine  Serology  Results Review (optional):[id number]}   Objective    BP (!) 147/87 (BP Location: Right Arm, Patient Position: Sitting, Cuff Size: Normal)   Pulse 72   Temp 98.5 F (36.9 C) (Oral)   Wt 147 lb (66.7 kg)   SpO2 100%   BMI 25.23 kg/m  Vitals:   06/27/22 1505 06/27/22 1506  BP: (!) 160/70 (!) 147/87  Pulse: 72   Temp: 98.5 F (36.9 C)   TempSrc: Oral   SpO2: 100%   Weight: 147 lb (66.7 kg)      Physical Exam  ***  No results found for any visits on 06/27/22.  Assessment & Plan     ***  No follow-ups on file.        [facility]

## 2022-06-29 ENCOUNTER — Encounter: Payer: Self-pay | Admitting: Physician Assistant

## 2022-06-29 DIAGNOSIS — M0579 Rheumatoid arthritis with rheumatoid factor of multiple sites without organ or systems involvement: Secondary | ICD-10-CM | POA: Diagnosis not present

## 2022-06-29 DIAGNOSIS — M810 Age-related osteoporosis without current pathological fracture: Secondary | ICD-10-CM | POA: Diagnosis not present

## 2022-06-29 DIAGNOSIS — Z79899 Other long term (current) drug therapy: Secondary | ICD-10-CM | POA: Diagnosis not present

## 2022-06-29 DIAGNOSIS — I7300 Raynaud's syndrome without gangrene: Secondary | ICD-10-CM | POA: Diagnosis not present

## 2022-06-29 DIAGNOSIS — M064 Inflammatory polyarthropathy: Secondary | ICD-10-CM | POA: Diagnosis not present

## 2022-07-10 DIAGNOSIS — H401213 Low-tension glaucoma, right eye, severe stage: Secondary | ICD-10-CM | POA: Diagnosis not present

## 2022-07-10 DIAGNOSIS — H35371 Puckering of macula, right eye: Secondary | ICD-10-CM | POA: Diagnosis not present

## 2022-07-10 DIAGNOSIS — H04123 Dry eye syndrome of bilateral lacrimal glands: Secondary | ICD-10-CM | POA: Diagnosis not present

## 2022-07-10 DIAGNOSIS — H401222 Low-tension glaucoma, left eye, moderate stage: Secondary | ICD-10-CM | POA: Diagnosis not present

## 2022-07-10 DIAGNOSIS — H2512 Age-related nuclear cataract, left eye: Secondary | ICD-10-CM | POA: Diagnosis not present

## 2022-07-13 DIAGNOSIS — M064 Inflammatory polyarthropathy: Secondary | ICD-10-CM | POA: Diagnosis not present

## 2022-07-13 DIAGNOSIS — M0579 Rheumatoid arthritis with rheumatoid factor of multiple sites without organ or systems involvement: Secondary | ICD-10-CM | POA: Diagnosis not present

## 2022-07-24 DIAGNOSIS — H20013 Primary iridocyclitis, bilateral: Secondary | ICD-10-CM | POA: Diagnosis not present

## 2022-07-24 DIAGNOSIS — H04123 Dry eye syndrome of bilateral lacrimal glands: Secondary | ICD-10-CM | POA: Diagnosis not present

## 2022-08-01 ENCOUNTER — Other Ambulatory Visit: Payer: Self-pay | Admitting: Family Medicine

## 2022-08-01 DIAGNOSIS — I10 Essential (primary) hypertension: Secondary | ICD-10-CM

## 2022-08-01 DIAGNOSIS — I7300 Raynaud's syndrome without gangrene: Secondary | ICD-10-CM

## 2022-08-07 ENCOUNTER — Ambulatory Visit (INDEPENDENT_AMBULATORY_CARE_PROVIDER_SITE_OTHER): Payer: Medicare PPO

## 2022-08-07 VITALS — Ht 64.0 in | Wt 150.0 lb

## 2022-08-07 DIAGNOSIS — Z0000 Encounter for general adult medical examination without abnormal findings: Secondary | ICD-10-CM | POA: Diagnosis not present

## 2022-08-07 NOTE — Progress Notes (Signed)
.I connected with  LYLIAH LAVOY on 08/07/22 by a audio enabled telemedicine application and verified that I am speaking with the correct person using two identifiers.  Patient Location: Home  Provider Location: Office/Clinic  I discussed the limitations of evaluation and management by telemedicine. The patient expressed understanding and agreed to proceed.  Subjective:   Courtney Rodgers is a 72 y.o. female who presents for Medicare Annual (Subsequent) preventive examination.  Review of Systems     Cardiac Risk Factors include: advanced age (>4men, >54 women);hypertension;dyslipidemia     Objective:    Today's Vitals   08/07/22 1257  Weight: 150 lb (68 kg)  Height: 5\' 4"  (1.626 m)   Body mass index is 25.75 kg/m.     08/07/2022    1:09 PM 08/02/2021    1:03 PM 04/12/2020    3:11 PM 11/13/2019    9:32 AM  Advanced Directives  Does Patient Have a Medical Advance Directive? Yes No No No  Type of Estate agent of [place];Living will     Would patient like information on creating a medical advance directive?  No - Patient declined No - Patient declined No - Patient declined    Current Medications (verified) Outpatient Encounter Medications as of 08/07/2022  Medication Sig   alendronate (FOSAMAX) 70 MG tablet Take 70 mg by mouth once a week.   amLODipine (NORVASC) 5 MG tablet TAKE 1 TABLET (5 MG TOTAL) BY MOUTH DAILY.   atorvastatin (LIPITOR) 10 MG tablet TAKE 1 TABLET BY MOUTH EVERY DAY   azelastine (OPTIVAR) 0.05 % ophthalmic solution    Biotin 5000 MCG CAPS 1 capsule daily.   Calcium Carbonate-Vit D-Min (CALCIUM 1200 PO) 1 tablet daily.   carvedilol (COREG) 12.5 MG tablet Take 12.5 mg by mouth 2 (two) times daily with a meal.   COMBIGAN 0.2-0.5 % ophthalmic solution 1 DROP IN BOTH EYES TWICE A DAY   folic acid (FOLVITE) 800 MCG tablet 1 tablet daily.   latanoprost (XALATAN) 0.005 % ophthalmic solution Place 1 drop into both eyes at bedtime.    methotrexate 2.5 MG tablet 6 tablets once a week.   Multiple Vitamin (MULTI-VITAMIN) tablet Take 1 tablet by mouth daily.   prednisoLONE acetate (PRED FORTE) 1 % ophthalmic suspension PLEASE SEE ATTACHED FOR DETAILED DIRECTIONS   triamterene-hydrochlorothiazide (MAXZIDE-25) 37.5-25 MG tablet TAKE 1 TABLET BY MOUTH EVERY DAY   valACYclovir (VALTREX) 1000 MG tablet TAKE 1 TABLET BY MOUTH EVERY DAY   cephALEXin (KEFLEX) 500 MG capsule Take 1 capsule (500 mg total) by mouth 2 (two) times daily. (Patient not taking: Reported on 08/07/2022)   No facility-administered encounter medications on file as of 08/07/2022.    Allergies (verified) Patient has no known allergies.   History: Past Medical History:  Diagnosis Date   Acid reflux    Allergy    Diverticulosis 12/25/2013   Hyperlipidemia    Hypertension    Raynaud's disease    Rheumatoid arthritis (HCC) 01/28/2014   [facility]   Past Surgical History:  Procedure Laterality Date   APPENDECTOMY  1980   CATARACT EXTRACTION Right    COLONOSCOPY  12/25/2013   repeat every 5 years per [facility]; 09/15/2011, 05/18/2008   COLONOSCOPY WITH PROPOFOL N/A 11/13/2019   Procedure: COLONOSCOPY WITH PROPOFOL;  Surgeon: Toledo, Boykin Nearing, MD;  Location: [facility] END[facility]COPY;  Service: Gastroenterology;  Laterality: N/A;   ESOPHAGOGASTRODUODENOSCOPY     ESOPHAGOGASTRODUODENOSCOPY (EGD) WITH PROPOFOL N/A 11/13/2019   Procedure: ESOPHAGOGASTRODUODENOSCOPY (EGD) WITH PROPOFOL;  Surgeon:  Toledo, Boykin Nearing, MD;  Location: [facility] END[facility]COPY;  Service: Gastroenterology;  Laterality: N/A;   TONSILLECTOMY  1960   Family History  Problem Relation Age of Onset   Hypertension Mother    Diabetes Mother    Transient ischemic attack Mother    Congestive Heart Failure Mother    Dementia Mother    CVA Father    Emphysema Father    Alcohol abuse Father    Drug abuse Father    Hypertension Father    Hypertension Sister    Hypertension Sister    Hypertension Brother    Seizures Brother     Hypertension Brother    Breast cancer Neg Hx    Social History   Socioeconomic History   Marital status: Divorced    Spouse name: Not on file   Number of children: 1   Years of education: Not on file   Highest education level: Bachelor's degree (e.g., BA, AB, BS)  Occupational History   Occupation: retired   Occupation: part time subsitute  Tobacco Use   Smoking status: Former    Packs/day: 1.00    Years: 32.00    Additional pack years: 0.00    Total pack years: 32.00    Types: Cigarettes    Quit date: 2011    Years since quitting: 13.3   Smokeless tobacco: Never   Tobacco comments:    QUIT IN 2010  Vaping Use   Vaping Use: Never used  Substance and Sexual Activity   Alcohol use: Yes    Alcohol/week: 7.0 standard drinks of alcohol    Types: 7 Glasses of wine per week    Comment: 1 glass a night   Drug use: No   Sexual activity: Not on file  Other Topics Concern   Not on file  Social History Narrative   Not on file   Social Determinants of Health   Financial Resource Strain: Low Risk  (08/06/2022)   Overall Financial Resource Strain (CARDIA)    Difficulty of Paying Living Expenses: Not very hard  Food Insecurity: No Food Insecurity (08/06/2022)   Hunger Vital Sign    Worried About Running Out of Food in the Last Year: Never true    Ran Out of Food in the Last Year: Never true  Transportation Needs: No Transportation Needs (08/06/2022)   PRAPARE - Administrator, Civil Service (Medical): No    Lack of Transportation (Non-Medical): No  Physical Activity: Sufficiently Active (06/27/2022)   Exercise Vital Sign    Days of Exercise per Week: 7 days    Minutes of Exercise per Session: 70 min  Stress: Stress Concern Present (08/06/2022)   [employer] of Occupational Health - Occupational Stress Questionnaire    Feeling of Stress : Rather much  Social Connections: Moderately Integrated (08/06/2022)   Social Connection and Isolation Panel [NHANES]     Frequency of Communication with Friends and Family: More than three times a week    Frequency of Social Gatherings with Friends and Family: Three times a week    Attends Religious Services: More than 4 times per year    Active Member of Clubs or Organizations: Yes    Attends Engineer, structural: More than 4 times per year    Marital Status: Divorced    Tobacco Counseling Counseling given: Not Answered Tobacco comments: QUIT IN 2010   Clinical Intake:  Pre-visit preparation completed: Yes  Pain : No/denies pain   BMI - recorded: 25.75 Nutritional Risks:  None Diabetes: No  How often do you need to have someone help you when you read instructions, pamphlets, or other written materials from your doctor or pharmacy?: 1 - Never  Diabetic?no  Interpreter Needed?: No  Comments: lives alone Information entered by :: B.Bryson Palen,LPN   Activities of Daily Living    08/06/2022    3:02 PM 05/17/2022    9:11 AM  In your present state of health, do you have any difficulty performing the following activities:  Hearing? 0 0  Vision? 1 1  Difficulty concentrating or making decisions? 0 0  Walking or climbing stairs? 0 0  Dressing or bathing? 0 0  Doing errands, shopping? 0 0  Preparing Food and eating ? N   Using the Toilet? N   In the past six months, have you accidently leaked urine? N   Do you have problems with loss of bowel control? N   Managing your Medications? N   Managing your Finances? N   Housekeeping or managing your Housekeeping? N     Patient Care Team: Erasmo Downer, MD as PCP - General (Family Medicine) Thurnell Garbe, OD (Optometry) Kandyce Rud., MD (Rheumatology)  Indicate any recent Medical Services you may have received from other than [facility] providers in the past year (date may be approximate).     Assessment:   This is a routine wellness examination for Windi.  Hearing/Vision screen Hearing Screening - Comments:: Adequate  hearing Vision Screening - Comments:: Vision -eyes feel scratchy, sore and sensitivity to light;lft eye blurry;taking two eye gtts Dr Clydene Pugh  Dietary issues and exercise activities discussed: Current Exercise Habits: Home exercise routine, Type of exercise: walking, Time (Minutes): 30, Frequency (Times/Week): 6, Weekly Exercise (Minutes/Week): 180, Intensity: Mild, Exercise limited by: orthopedic condition(s);cardiac condition(s)   Goals Addressed             This Visit's Progress    DIET - EAT MORE FRUITS AND VEGETABLES   On track    Recommend to start eating 2 servings of fruit every day.        Depression Screen    08/07/2022    1:08 PM 05/17/2022    9:11 AM 04/06/2022    2:24 PM 09/09/2021   11:09 AM 08/02/2021    1:01 PM 04/12/2020    3:08 PM 02/26/2020   11:51 AM  PHQ 2/9 Scores  PHQ - 2 Score 0 0 0 0 0 0 1  PHQ- 9 Score  1 2 1   5     Fall Risk    08/06/2022    3:02 PM 05/17/2022    9:11 AM 04/06/2022    2:24 PM 09/09/2021   11:09 AM 08/02/2021    1:03 PM  Fall Risk   Falls in the past year? 0 0 0 0 0  Number falls in past yr: 0 0 0 0 0  Injury with Fall? 0 0 0 0 0  Risk for fall due to :  No Fall Risks No Fall Risks No Fall Risks   Follow up  Falls evaluation completed Falls evaluation completed Falls evaluation completed Falls evaluation completed    FALL RISK PREVENTION PERTAINING TO THE HOME:  Any stairs in or around the home? Yes  If so, are there any without handrails? Yes  Home free of loose throw rugs in walkways, pet beds, electrical cords, etc? Yes  Adequate lighting in your home to reduce risk of falls? Yes   ASSISTIVE DEVICES UTILIZED TO PREVENT FALLS:  Life alert? No  Use of a cane, walker or w/c? No  Grab bars in the bathroom? No  Shower chair or bench in shower? No  Elevated toilet seat or a handicapped toilet? Yes   Cognitive Function:        08/07/2022    1:14 PM 08/02/2021    1:05 PM 02/21/2018   10:20 AM  6CIT Screen  What Year? 0  points 0 points 0 points  What month? 0 points 0 points 0 points  What time? 0 points 0 points 0 points  Count back from 20 0 points 0 points 0 points  Months in reverse 0 points 0 points 0 points  Repeat phrase 0 points 2 points 2 points  Total Score 0 points 2 points 2 points    Immunizations Immunization History  Administered Date(s) Administered   Influenza Inj Mdck Quad Pf 01/08/2017   Influenza Split 02/01/2011   Influenza, High Dose Seasonal PF 01/22/2018, 12/20/2018, 12/18/2021   Influenza,inj,Quad PF,6+ Mos 12/21/2014   Influenza-Unspecified 12/20/2018, 12/28/2019, 12/01/2020   PFIZER Comirnaty(Gray Top)Covid-19 Tri-Sucrose Vaccine 12/08/2020, 12/22/2021   PFIZER(Purple Top)SARS-COV-2 Vaccination 04/11/2019, 05/02/2019, 01/14/2020   PNEUMOCOCCAL CONJUGATE-20 12/18/2021   Pfizer Covid-19 Vaccine Bivalent Booster 51yrs & up 06/07/2021   Pneumococcal Conjugate-13 08/22/2017   Pneumococcal Polysaccharide-23 02/25/2019   Td 06/07/2021   Tdap 10/18/2009   Zoster Recombinat (Shingrix) 04/08/2020, 07/22/2020   Zoster, Live 04/24/2013    TDAP status: Up to date  Flu Vaccine status: Up to date  Pneumococcal vaccine status: Up to date  Covid-19 vaccine status: Completed vaccines  Qualifies for Shingles Vaccine? Yes   Zostavax completed Yes   Shingrix Completed?: Yes  Screening Tests Health Maintenance  Topic Date Due   Lung Cancer Screening  04/02/2018   COVID-19 Vaccine (7 - 2023-24 season) 02/16/2022   DEXA SCAN  04/19/2022   INFLUENZA VACCINE  10/19/2022   Medicare Annual Wellness (AWV)  08/07/2023   MAMMOGRAM  11/16/2023   COLONOSCOPY (Pts 45-63yrs Insurance coverage will need to be confirmed)  11/12/2024   DTaP/Tdap/Td (3 - Td or Tdap) 06/08/2031   Pneumonia Vaccine 86+ Years old  Completed   Hepatitis C Screening  Completed   Zoster Vaccines- Shingrix  Completed   HPV VACCINES  Aged Out    Health Maintenance  Health Maintenance Due  Topic Date Due    Lung Cancer Screening  04/02/2018   COVID-19 Vaccine (7 - 2023-24 season) 02/16/2022   DEXA SCAN  04/19/2022    Colorectal cancer screening: Type of screening: Colonoscopy. Completed yes. Repeat every 5 years  Mammogram status: Completed yes. Repeat every year  Bone Density status: Completed yes. Results reflect: Bone density results: OSTEOPENIA. Repeat every 5 years.  Lung Cancer Screening: (Low Dose CT Chest recommended if Age 59-80 years, 30 pack-year currently smoking OR have quit w/in 15years.) does not qualify.   Lung Cancer Screening Referral: no  Additional Screening:  Hepatitis C Screening: does not qualify; Completed yes  Vision Screening: Recommended annual ophthalmology exams for early detection of glaucoma and other disorders of the eye. Is the patient up to date with their annual eye exam?  No  Who is the provider or what is the name of the office in which the patient attends annual eye exams? Dr Clydene Pugh If pt is not established with a provider, would they like to be referred to a provider to establish care? No .   Dental Screening: Recommended annual dental exams for proper oral hygiene  [employer] [facility]  Referral / Chronic Care Management: CRR required this visit?  No   CCM required this visit?  No    Plan:     I have personally reviewed and noted the following in the patient's chart:   Medical and social history Use of alcohol, tobacco or illicit drugs  Current medications and supplements including opioid prescriptions. Patient is not currently taking opioid prescriptions. Functional ability and status Nutritional status Physical activity Advanced directives List of other physicians Hospitalizations, surgeries, and ER visits in previous 12 months Vitals Screenings to include cognitive, depression, and falls Referrals and appointments  In addition, I have reviewed and discussed with patient certain preventive protocols, quality metrics, and best  practice recommendations. A written personalized care plan for preventive services as well as general preventive health recommendations were provided to patient.    Sue Lush, LPN   1/61/0960   Nurse Notes: Pt relays she is doing well other than dealing with inflammation in her left eye/blurry vision (scratchy eyes) in which she is taking eye drops for it. She also relays that she is dealing with lft great toe  swelling and pain,in which she relays the Colchicine did not work. She relays that she was given Prednisone by Rheumatologist (which helped). She has concerns or questions at this time

## 2022-08-07 NOTE — Patient Instructions (Signed)
Courtney Rodgers , Thank you for taking time to come for your Medicare Wellness Visit. I appreciate your ongoing commitment to your health goals. Please review the following plan we discussed and let me know if I can assist you in the future.   These are the goals we discussed:  Goals      DIET - EAT MORE FRUITS AND VEGETABLES     Recommend to start eating 2 servings of fruit every day.      DIET - EAT MORE FRUITS AND VEGETABLES        This is a list of the screening recommended for you and due dates:  Health Maintenance  Topic Date Due   Screening for Lung Cancer  04/02/2018   COVID-19 Vaccine (7 - 2023-24 season) 02/16/2022   DEXA scan (bone density measurement)  04/19/2022   Flu Shot  10/19/2022   Medicare Annual Wellness Visit  08/07/2023   Mammogram  11/16/2023   Colon Cancer Screening  11/12/2024   DTaP/Tdap/Td vaccine (3 - Td or Tdap) 06/08/2031   Pneumonia Vaccine  Completed   Hepatitis C Screening: USPSTF Recommendation to screen - Ages 18-79 yo.  Completed   Zoster (Shingles) Vaccine  Completed   HPV Vaccine  Aged Out    Advanced directives: yes  Conditions/risks identified: low falls risk  Next appointment: Follow up in one year for your annual wellness visit 08/08/23 @1pm  telephone   Preventive Care 65 Years and Older, Female Preventive care refers to lifestyle choices and visits with your health care provider that can promote health and wellness. What does preventive care include? A yearly physical exam. This is also called an annual well check. Dental exams once or twice a year. Routine eye exams. Ask your health care provider how often you should have your eyes checked. Personal lifestyle choices, including: Daily care of your teeth and gums. Regular physical activity. Eating a healthy diet. Avoiding tobacco and drug use. Limiting alcohol use. Practicing safe sex. Taking low-dose aspirin every day. Taking vitamin and mineral supplements as recommended by  your health care provider. What happens during an annual well check? The services and screenings done by your health care provider during your annual well check will depend on your age, overall health, lifestyle risk factors, and family history of disease. Counseling  Your health care provider may ask you questions about your: Alcohol use. Tobacco use. Drug use. Emotional well-being. Home and relationship well-being. Sexual activity. Eating habits. History of falls. Memory and ability to understand (cognition). Work and work Astronomer. Reproductive health. Screening  You may have the following tests or measurements: Height, weight, and BMI. Blood pressure. Lipid and cholesterol levels. These may be checked every 5 years, or more frequently if you are over 27 years old. Skin check. Lung cancer screening. You may have this screening every year starting at age 87 if you have a 30-pack-year history of smoking and currently smoke or have quit within the past 15 years. Fecal occult blood test (FOBT) of the stool. You may have this test every year starting at age 73. Flexible sigmoidoscopy or colonoscopy. You may have a sigmoidoscopy every 5 years or a colonoscopy every 10 years starting at age 33. Hepatitis C blood test. Hepatitis B blood test. Sexually transmitted disease (STD) testing. Diabetes screening. This is done by checking your blood sugar (glucose) after you have not eaten for a while (fasting). You may have this done every 1-3 years. Bone density scan. This is done to screen  for osteoporosis. You may have this done starting at age 4. Mammogram. This may be done every 1-2 years. Talk to your health care provider about how often you should have regular mammograms. Talk with your health care provider about your test results, treatment options, and if necessary, the need for more tests. Vaccines  Your health care provider may recommend certain vaccines, such as: Influenza  vaccine. This is recommended every year. Tetanus, diphtheria, and acellular pertussis (Tdap, Td) vaccine. You may need a Td booster every 10 years. Zoster vaccine. You may need this after age 32. Pneumococcal 13-valent conjugate (PCV13) vaccine. One dose is recommended after age 12. Pneumococcal polysaccharide (PPSV23) vaccine. One dose is recommended after age 63. Talk to your health care provider about which screenings and vaccines you need and how often you need them. This information is not intended to replace advice given to you by your health care provider. Make sure you discuss any questions you have with your health care provider. Document Released: 04/02/2015 Document Revised: 11/24/2015 Document Reviewed: 01/05/2015 Elsevier Interactive Patient Education  2017 [employer].  Fall Prevention in the Home Falls can cause injuries. They can happen to people of all ages. There are many things you can do to make your home safe and to help prevent falls. What can I do on the outside of my home? Regularly fix the edges of walkways and driveways and fix any cracks. Remove anything that might make you trip as you walk through a door, such as a raised step or threshold. Trim any bushes or trees on the path to your home. Use bright outdoor lighting. Clear any walking paths of anything that might make someone trip, such as rocks or tools. Regularly check to see if handrails are loose or broken. Make sure that both sides of any steps have handrails. Any raised decks and porches should have guardrails on the edges. Have any leaves, snow, or ice cleared regularly. Use sand or salt on walking paths during winter. Clean up any spills in your garage right away. This includes oil or grease spills. What can I do in the bathroom? Use night lights. Install grab bars by the toilet and in the tub and shower. Do not use towel bars as grab bars. Use non-skid mats or decals in the tub or shower. If you  need to sit down in the shower, use a plastic, non-slip stool. Keep the floor dry. Clean up any water that spills on the floor as soon as it happens. Remove soap buildup in the tub or shower regularly. Attach bath mats securely with double-sided non-slip rug tape. Do not have throw rugs and other things on the floor that can make you trip. What can I do in the bedroom? Use night lights. Make sure that you have a light by your bed that is easy to reach. Do not use any sheets or blankets that are too big for your bed. They should not hang down onto the floor. Have a firm chair that has side arms. You can use this for support while you get dressed. Do not have throw rugs and other things on the floor that can make you trip. What can I do in the kitchen? Clean up any spills right away. Avoid walking on wet floors. Keep items that you use a lot in easy-to-reach places. If you need to reach something above you, use a strong step stool that has a grab bar. Keep electrical cords out of the way. Do  not use floor polish or wax that makes floors slippery. If you must use wax, use non-skid floor wax. Do not have throw rugs and other things on the floor that can make you trip. What can I do with my stairs? Do not leave any items on the stairs. Make sure that there are handrails on both sides of the stairs and use them. Fix handrails that are broken or loose. Make sure that handrails are as long as the stairways. Check any carpeting to make sure that it is firmly attached to the stairs. Fix any carpet that is loose or worn. Avoid having throw rugs at the top or bottom of the stairs. If you do have throw rugs, attach them to the floor with carpet tape. Make sure that you have a light switch at the top of the stairs and the bottom of the stairs. If you do not have them, ask someone to add them for you. What else can I do to help prevent falls? Wear shoes that: Do not have high heels. Have rubber  bottoms. Are comfortable and fit you well. Are closed at the toe. Do not wear sandals. If you use a stepladder: Make sure that it is fully opened. Do not climb a closed stepladder. Make sure that both sides of the stepladder are locked into place. Ask someone to hold it for you, if possible. Clearly mark and make sure that you can see: Any grab bars or handrails. First and last steps. Where the edge of each step is. Use tools that help you move around (mobility aids) if they are needed. These include: Canes. Walkers. Scooters. Crutches. Turn on the lights when you go into a dark area. Replace any light bulbs as soon as they burn out. Set up your furniture so you have a clear path. Avoid moving your furniture around. If any of your floors are uneven, fix them. If there are any pets around you, be aware of where they are. Review your medicines with your doctor. Some medicines can make you feel dizzy. This can increase your chance of falling. Ask your doctor what other things that you can do to help prevent falls. This information is not intended to replace advice given to you by your health care provider. Make sure you discuss any questions you have with your health care provider. Document Released: 12/31/2008 Document Revised: 08/12/2015 Document Reviewed: 04/10/2014 Elsevier Interactive Patient Education  2017 [employer].

## 2022-08-22 DIAGNOSIS — H04123 Dry eye syndrome of bilateral lacrimal glands: Secondary | ICD-10-CM | POA: Diagnosis not present

## 2022-08-22 DIAGNOSIS — H20011 Primary iridocyclitis, right eye: Secondary | ICD-10-CM | POA: Diagnosis not present

## 2022-09-12 DIAGNOSIS — I7300 Raynaud's syndrome without gangrene: Secondary | ICD-10-CM | POA: Diagnosis not present

## 2022-09-12 DIAGNOSIS — M19072 Primary osteoarthritis, left ankle and foot: Secondary | ICD-10-CM | POA: Diagnosis not present

## 2022-09-12 DIAGNOSIS — M064 Inflammatory polyarthropathy: Secondary | ICD-10-CM | POA: Diagnosis not present

## 2022-09-12 DIAGNOSIS — Z79899 Other long term (current) drug therapy: Secondary | ICD-10-CM | POA: Diagnosis not present

## 2022-09-12 DIAGNOSIS — M0579 Rheumatoid arthritis with rheumatoid factor of multiple sites without organ or systems involvement: Secondary | ICD-10-CM | POA: Diagnosis not present

## 2022-09-18 ENCOUNTER — Other Ambulatory Visit: Payer: Self-pay | Admitting: Family Medicine

## 2022-09-18 DIAGNOSIS — I10 Essential (primary) hypertension: Secondary | ICD-10-CM

## 2022-09-18 DIAGNOSIS — I7300 Raynaud's syndrome without gangrene: Secondary | ICD-10-CM

## 2022-09-26 DIAGNOSIS — M064 Inflammatory polyarthropathy: Secondary | ICD-10-CM | POA: Diagnosis not present

## 2022-10-17 ENCOUNTER — Other Ambulatory Visit: Payer: Self-pay | Admitting: Family Medicine

## 2022-10-17 DIAGNOSIS — Z1231 Encounter for screening mammogram for malignant neoplasm of breast: Secondary | ICD-10-CM

## 2022-11-01 DIAGNOSIS — H35371 Puckering of macula, right eye: Secondary | ICD-10-CM | POA: Diagnosis not present

## 2022-11-01 DIAGNOSIS — H2512 Age-related nuclear cataract, left eye: Secondary | ICD-10-CM | POA: Diagnosis not present

## 2022-11-01 DIAGNOSIS — H04123 Dry eye syndrome of bilateral lacrimal glands: Secondary | ICD-10-CM | POA: Diagnosis not present

## 2022-11-01 DIAGNOSIS — H401213 Low-tension glaucoma, right eye, severe stage: Secondary | ICD-10-CM | POA: Diagnosis not present

## 2022-11-01 DIAGNOSIS — H401222 Low-tension glaucoma, left eye, moderate stage: Secondary | ICD-10-CM | POA: Diagnosis not present

## 2022-11-15 ENCOUNTER — Other Ambulatory Visit: Payer: Self-pay | Admitting: Family Medicine

## 2022-11-15 DIAGNOSIS — E7800 Pure hypercholesterolemia, unspecified: Secondary | ICD-10-CM

## 2022-11-16 NOTE — Telephone Encounter (Signed)
Requested Prescriptions  Pending Prescriptions Disp Refills   atorvastatin (LIPITOR) 10 MG tablet [Pharmacy Med Name: ATORVASTATIN 10 MG TABLET] 90 tablet 0    Sig: TAKE 1 TABLET BY MOUTH EVERY DAY     Cardiovascular:  Antilipid - Statins Failed - 11/15/2022  2:28 AM      Failed - Lipid Panel in normal range within the last 12 months    Cholesterol, Total  Date Value Ref Range Status  04/06/2022 249 (H) 100 - 199 mg/dL Final   LDL Cholesterol (Calc)  Date Value Ref Range Status  02/02/2017 113 (H) mg/dL (calc) Final    Comment:    Reference range: <100 . Desirable range <100 mg/dL for primary prevention;   <70 mg/dL for patients with CHD or diabetic patients  with > or = 2 CHD risk factors. Marland Kitchen LDL-C is now calculated using the Martin-Hopkins  calculation, which is a validated novel method providing  better accuracy than the Friedewald equation in the  estimation of LDL-C.  Horald Pollen et al. Lenox Ahr. 1610;960(45): 2061-2068  (http://education.[employer].com/faq/FAQ164)    LDL Chol Calc (NIH)  Date Value Ref Range Status  04/06/2022 108 (H) 0 - 99 mg/dL Final   HDL  Date Value Ref Range Status  04/06/2022 130 >39 mg/dL Final   Triglycerides  Date Value Ref Range Status  04/06/2022 67 0 - 149 mg/dL Final         Passed - Patient is not pregnant      Passed - Valid encounter within last 12 months    Recent Outpatient Visits           4 months ago Cellulitis of left lower extremity   [facility] [place], [place], PA-C   6 months ago [place] toe pain, left   [place] [facility] [place], [place], PA-C   7 months ago Essential (primary) hypertension   [place] [facility] [place], Marzella Schlein, MD   1 year ago Essential (primary) hypertension   [place] [facility] [place], Marzella Schlein, MD   1 year ago Foreign body in mouth, initial encounter   [facility] [place],  [place], [place]

## 2022-11-17 ENCOUNTER — Ambulatory Visit
Admission: RE | Admit: 2022-11-17 | Discharge: 2022-11-17 | Disposition: A | Discharge status: Home / Self Care | Payer: Medicare PPO | Source: Ambulatory Visit | Attending: Family Medicine | Admitting: Family Medicine

## 2022-11-17 DIAGNOSIS — Z1231 Encounter for screening mammogram for malignant neoplasm of breast: Secondary | ICD-10-CM

## 2022-11-28 ENCOUNTER — Other Ambulatory Visit: Payer: Self-pay | Admitting: Family Medicine

## 2022-11-28 DIAGNOSIS — B009 Herpesviral infection, unspecified: Secondary | ICD-10-CM

## 2022-11-28 DIAGNOSIS — I10 Essential (primary) hypertension: Secondary | ICD-10-CM

## 2022-11-28 DIAGNOSIS — E7800 Pure hypercholesterolemia, unspecified: Secondary | ICD-10-CM

## 2022-11-28 DIAGNOSIS — I7300 Raynaud's syndrome without gangrene: Secondary | ICD-10-CM

## 2022-12-01 DIAGNOSIS — I10 Essential (primary) hypertension: Secondary | ICD-10-CM | POA: Diagnosis not present

## 2022-12-01 DIAGNOSIS — H401132 Primary open-angle glaucoma, bilateral, moderate stage: Secondary | ICD-10-CM | POA: Diagnosis not present

## 2022-12-01 DIAGNOSIS — H2512 Age-related nuclear cataract, left eye: Secondary | ICD-10-CM | POA: Diagnosis not present

## 2022-12-01 DIAGNOSIS — M069 Rheumatoid arthritis, unspecified: Secondary | ICD-10-CM | POA: Diagnosis not present

## 2022-12-01 MED ORDER — VALACYCLOVIR HCL 1 G PO TABS: 1000.0000 mg | ORAL_TABLET | Freq: Every day | ORAL | 0 refills | Status: DC

## 2022-12-01 NOTE — Telephone Encounter (Signed)
Last ordered: 5 years ago (02/01/2017) by Historical Provider, MD

## 2022-12-05 DIAGNOSIS — H401132 Primary open-angle glaucoma, bilateral, moderate stage: Secondary | ICD-10-CM | POA: Diagnosis not present

## 2022-12-05 DIAGNOSIS — M069 Rheumatoid arthritis, unspecified: Secondary | ICD-10-CM | POA: Diagnosis not present

## 2022-12-05 DIAGNOSIS — H2512 Age-related nuclear cataract, left eye: Secondary | ICD-10-CM | POA: Diagnosis not present

## 2022-12-05 DIAGNOSIS — I10 Essential (primary) hypertension: Secondary | ICD-10-CM | POA: Diagnosis not present

## 2023-01-03 DIAGNOSIS — M1A00X0 Idiopathic chronic gout, unspecified site, without tophus (tophi): Secondary | ICD-10-CM | POA: Diagnosis not present

## 2023-01-03 DIAGNOSIS — I7300 Raynaud's syndrome without gangrene: Secondary | ICD-10-CM | POA: Diagnosis not present

## 2023-01-03 DIAGNOSIS — M0579 Rheumatoid arthritis with rheumatoid factor of multiple sites without organ or systems involvement: Secondary | ICD-10-CM | POA: Diagnosis not present

## 2023-01-03 DIAGNOSIS — Z79899 Other long term (current) drug therapy: Secondary | ICD-10-CM | POA: Diagnosis not present

## 2023-01-29 DIAGNOSIS — Z961 Presence of intraocular lens: Secondary | ICD-10-CM | POA: Diagnosis not present

## 2023-01-29 DIAGNOSIS — H401132 Primary open-angle glaucoma, bilateral, moderate stage: Secondary | ICD-10-CM | POA: Diagnosis not present

## 2023-01-29 DIAGNOSIS — I10 Essential (primary) hypertension: Secondary | ICD-10-CM | POA: Diagnosis not present

## 2023-01-29 DIAGNOSIS — H2512 Age-related nuclear cataract, left eye: Secondary | ICD-10-CM | POA: Diagnosis not present

## 2023-02-06 DIAGNOSIS — H401213 Low-tension glaucoma, right eye, severe stage: Secondary | ICD-10-CM | POA: Diagnosis not present

## 2023-02-06 DIAGNOSIS — H2512 Age-related nuclear cataract, left eye: Secondary | ICD-10-CM | POA: Diagnosis not present

## 2023-02-06 DIAGNOSIS — H04123 Dry eye syndrome of bilateral lacrimal glands: Secondary | ICD-10-CM | POA: Diagnosis not present

## 2023-02-06 DIAGNOSIS — H35371 Puckering of macula, right eye: Secondary | ICD-10-CM | POA: Diagnosis not present

## 2023-02-06 DIAGNOSIS — H401222 Low-tension glaucoma, left eye, moderate stage: Secondary | ICD-10-CM | POA: Diagnosis not present

## 2023-02-06 DIAGNOSIS — H26491 Other secondary cataract, right eye: Secondary | ICD-10-CM | POA: Diagnosis not present

## 2023-02-27 ENCOUNTER — Other Ambulatory Visit: Payer: Self-pay | Admitting: Family Medicine

## 2023-02-27 DIAGNOSIS — I7300 Raynaud's syndrome without gangrene: Secondary | ICD-10-CM

## 2023-02-27 DIAGNOSIS — E7800 Pure hypercholesterolemia, unspecified: Secondary | ICD-10-CM

## 2023-02-27 DIAGNOSIS — I10 Essential (primary) hypertension: Secondary | ICD-10-CM

## 2023-02-27 DIAGNOSIS — B009 Herpesviral infection, unspecified: Secondary | ICD-10-CM

## 2023-02-28 NOTE — Telephone Encounter (Signed)
Requested Prescriptions  Pending Prescriptions Disp Refills   amLODipine (NORVASC) 5 MG tablet [Pharmacy Med Name: AMLODIPINE BESYLATE 5 MG TAB] 30 tablet 0    Sig: TAKE 1 TABLET (5 MG TOTAL) BY MOUTH DAILY.     Cardiovascular: Calcium Channel Blockers 2 Failed - 02/27/2023  8:56 PM      Failed - Last BP in normal range    BP Readings from Last 1 Encounters:  06/27/22 (!) 147/87         Failed - Valid encounter within last 6 months    Recent Outpatient Visits           8 months ago Cellulitis of left lower extremity   [place] [facility] [place], [place], PA-C   9 months ago [place] toe pain, left   [place] [place], [place], PA-C   10 months ago Essential (primary) hypertension   [place] [facility] [place], Marzella Schlein, MD   1 year ago Essential (primary) hypertension   [place] [facility] [place], Marzella Schlein, MD   1 year ago Foreign body in mouth, initial encounter   [place] [facility] [place], [place], [place]              Passed - Last Heart Rate in normal range    Pulse Readings from Last 1 Encounters:  06/27/22 72          valACYclovir (VALTREX) 1000 MG tablet [Pharmacy Med Name: VALACYCLOVIR HCL 1 GRAM TABLET] 90 tablet 0    Sig: TAKE 1 TABLET BY MOUTH EVERY DAY     Antimicrobials:  Antiviral Agents - Anti-Herpetic Passed - 02/27/2023  8:56 PM      Passed - Valid encounter within last 12 months    Recent Outpatient Visits           8 months ago Cellulitis of left lower extremity   [place] [place], [place], PA-C   9 months ago [place] toe pain, left   [place] [facility] [place], [place], PA-C   10 months ago Essential (primary) hypertension   [place] [place], Marzella Schlein, MD   1 year ago Essential (primary) hypertension    [facility], Marzella Schlein, MD    1 year ago Foreign body in mouth, initial encounter   [facility] [place], [place], [place]

## 2023-03-24 DIAGNOSIS — J101 Influenza due to other identified influenza virus with other respiratory manifestations: Secondary | ICD-10-CM | POA: Diagnosis not present

## 2023-04-05 ENCOUNTER — Telehealth: Payer: Self-pay | Admitting: Family Medicine

## 2023-04-05 DIAGNOSIS — E7800 Pure hypercholesterolemia, unspecified: Secondary | ICD-10-CM

## 2023-04-05 MED ORDER — ATORVASTATIN CALCIUM 10 MG PO TABS: 10.0000 mg | ORAL_TABLET | Freq: Every day | ORAL | 0 refills | Status: DC

## 2023-04-05 NOTE — Telephone Encounter (Signed)
[facility] is requesting prescription refill atorvastatin (LIPITOR) 10 MG tablet  Please advise

## 2023-04-19 DIAGNOSIS — H2512 Age-related nuclear cataract, left eye: Secondary | ICD-10-CM | POA: Diagnosis not present

## 2023-04-19 DIAGNOSIS — H401122 Primary open-angle glaucoma, left eye, moderate stage: Secondary | ICD-10-CM | POA: Diagnosis not present

## 2023-04-26 DIAGNOSIS — H2512 Age-related nuclear cataract, left eye: Secondary | ICD-10-CM | POA: Diagnosis not present

## 2023-05-07 DIAGNOSIS — M0579 Rheumatoid arthritis with rheumatoid factor of multiple sites without organ or systems involvement: Secondary | ICD-10-CM | POA: Diagnosis not present

## 2023-05-07 DIAGNOSIS — Z79899 Other long term (current) drug therapy: Secondary | ICD-10-CM | POA: Diagnosis not present

## 2023-05-07 DIAGNOSIS — M7711 Lateral epicondylitis, right elbow: Secondary | ICD-10-CM | POA: Diagnosis not present

## 2023-05-07 DIAGNOSIS — I7300 Raynaud's syndrome without gangrene: Secondary | ICD-10-CM | POA: Diagnosis not present

## 2023-05-07 DIAGNOSIS — M1A00X0 Idiopathic chronic gout, unspecified site, without tophus (tophi): Secondary | ICD-10-CM | POA: Diagnosis not present

## 2023-05-07 DIAGNOSIS — R609 Edema, unspecified: Secondary | ICD-10-CM | POA: Diagnosis not present

## 2023-05-07 DIAGNOSIS — M810 Age-related osteoporosis without current pathological fracture: Secondary | ICD-10-CM | POA: Diagnosis not present

## 2023-05-10 ENCOUNTER — Encounter: Payer: Self-pay | Admitting: Family Medicine

## 2023-05-17 ENCOUNTER — Encounter: Payer: Self-pay | Admitting: Family Medicine

## 2023-05-17 ENCOUNTER — Ambulatory Visit: Payer: Medicare PPO | Admitting: Family Medicine

## 2023-05-17 VITALS — BP 109/74 | HR 77 | Ht 64.0 in | Wt 159.8 lb

## 2023-05-17 DIAGNOSIS — I10 Essential (primary) hypertension: Secondary | ICD-10-CM

## 2023-05-17 DIAGNOSIS — M0579 Rheumatoid arthritis with rheumatoid factor of multiple sites without organ or systems involvement: Secondary | ICD-10-CM

## 2023-05-17 DIAGNOSIS — Z87891 Personal history of nicotine dependence: Secondary | ICD-10-CM

## 2023-05-17 DIAGNOSIS — Z0000 Encounter for general adult medical examination without abnormal findings: Secondary | ICD-10-CM

## 2023-05-17 DIAGNOSIS — R739 Hyperglycemia, unspecified: Secondary | ICD-10-CM | POA: Diagnosis not present

## 2023-05-17 DIAGNOSIS — E7800 Pure hypercholesterolemia, unspecified: Secondary | ICD-10-CM | POA: Diagnosis not present

## 2023-05-17 DIAGNOSIS — Z0001 Encounter for general adult medical examination with abnormal findings: Secondary | ICD-10-CM | POA: Diagnosis not present

## 2023-05-17 DIAGNOSIS — I872 Venous insufficiency (chronic) (peripheral): Secondary | ICD-10-CM | POA: Diagnosis not present

## 2023-05-17 NOTE — Assessment & Plan Note (Signed)
 Recommend low carb diet Recheck A1c beforen ext visit

## 2023-05-17 NOTE — Assessment & Plan Note (Signed)
Previously well controlled Continue statin Repeat FLP 

## 2023-05-17 NOTE — Assessment & Plan Note (Signed)
 F/b Rheum

## 2023-05-17 NOTE — Assessment & Plan Note (Signed)
 Well controlled Continue current medications Recheck metabolic panel F/u in 6 months

## 2023-05-17 NOTE — Assessment & Plan Note (Signed)
 Chronic leg swelling for the past month, likely due to venous insufficiency. Swelling decreases overnight and with activity but returns during the day. Currently using compression socks as recommended by rheumatologist. Discussed the role of venous insufficiency and the importance of compression therapy. Explained that if no improvement after 3-6 months, referral to vascular specialist may be necessary for potential procedures to rejuvenate veins. - Continue wearing compression socks daily. - Consider referral to vascular specialist if no improvement after 3-6 months of compression therapy.

## 2023-05-17 NOTE — Progress Notes (Signed)
 Complete physical exam   Patient: Courtney Rodgers   DOB: 1950/06/03   73 y.o. Female  MRN: [id number] Visit Date: 05/17/2023  Today's healthcare provider: Shirlee Latch, MD   Chief Complaint  Patient presents with   Care Management    Dexa Scan - Completed March 28, 2022 at [facility] Lung Cancer Screening - yes   Annual Exam    Diet -  General, healthy Exercise - daily for a minimum of 50 minutes for 6 days a week Feeling - not like herself due to flares that are preventing her from participating in her normal activities Sleeping - depends on the day Concerns - lower leg edema and feet X 1 month ago daily, bilateral. Will go down over night and then returns in the mornings after being up for at least 40 minutes. Cataract surgery was not well and she will return tomorrow to surgeon   Subjective    Courtney Rodgers is a 73 y.o. female who presents today for a complete physical exam.    Discussed the use of AI scribe software for clinical note transcription with the patient, who gave verbal consent to proceed.  History of Present Illness   The patient, with a past medical history of smoking, gout, and high blood pressure, presents with worsening vision following cataract surgery a month ago. The patient reports that her vision is worse than before the surgery. The patient's eye doctor suggested that a crust may have developed around the cataract, which could be causing the vision problems. The patient has an appointment with the surgeon to address this issue.  In addition to the vision problems, the patient has been experiencing leg swelling for the past month. The swelling decreases overnight but returns within 40 minutes of waking up. The patient reports that walking on a treadmill helps to reduce the swelling, but it accumulates again throughout the day. The patient has been wearing compression socks as recommended by her rheumatologist.        Last depression screening  scores    05/17/2023    1:55 PM 08/07/2022    1:08 PM 05/17/2022    9:11 AM  PHQ 2/9 Scores  PHQ - 2 Score 5 0 0  PHQ- 9 Score 14  1   Last fall risk screening    08/06/2022    3:02 PM  Fall Risk   Falls in the past year? 0  Number falls in past yr: 0  Injury with Fall? 0        Medications: Outpatient Medications Prior to Visit  Medication Sig   alendronate (FOSAMAX) 70 MG tablet Take 70 mg by mouth once a week.   allopurinol (ZYLOPRIM) 100 MG tablet Take 100 mg by mouth daily.   amLODipine (NORVASC) 5 MG tablet TAKE 1 TABLET (5 MG TOTAL) BY MOUTH DAILY.   atorvastatin (LIPITOR) 10 MG tablet Take 1 tablet (10 mg total) by mouth daily. TAKE 1 TABLET BY MOUTH EVERY DAY   azelastine (OPTIVAR) 0.05 % ophthalmic solution    Biotin 5000 MCG CAPS 1 capsule daily.   Calcium Carbonate-Vit D-Min (CALCIUM 1200 PO) 1 tablet daily.   carvedilol (COREG) 12.5 MG tablet Take 12.5 mg by mouth 2 (two) times daily with a meal.   cephALEXin (KEFLEX) 500 MG capsule Take 1 capsule (500 mg total) by mouth 2 (two) times daily.   COMBIGAN 0.2-0.5 % ophthalmic solution 1 DROP IN BOTH EYES TWICE A DAY   folic acid (FOLVITE)  800 MCG tablet 1 tablet daily.   latanoprost (XALATAN) 0.005 % ophthalmic solution Place 1 drop into both eyes at bedtime.   methotrexate 2.5 MG tablet 6 tablets once a week.   moxifloxacin (VIGAMOX) 0.5 % ophthalmic solution Place 1 drop into the left eye.   Multiple Vitamin (MULTI-VITAMIN) tablet Take 1 tablet by mouth daily.   timolol (TIMOPTIC) 0.5 % ophthalmic solution Place 1 drop into both eyes 2 (two) times daily.   triamterene-hydrochlorothiazide (MAXZIDE-25) 37.5-25 MG tablet TAKE 1 TABLET BY MOUTH EVERY DAY   valACYclovir (VALTREX) 1000 MG tablet TAKE 1 TABLET BY MOUTH EVERY DAY   [DISCONTINUED] prednisoLONE acetate (PRED FORTE) 1 % ophthalmic suspension PLEASE SEE ATTACHED FOR DETAILED DIRECTIONS   No facility-administered medications prior to visit.    Review of  Systems    Objective    BP 109/74 (BP Location: Left Arm, Patient Position: Sitting, Cuff Size: Normal)   Pulse 77   Ht 5\' 4"  (1.626 m)   Wt 159 lb 12.8 oz (72.5 kg)   SpO2 98%   BMI 27.43 kg/m    Physical Exam Vitals reviewed.  Constitutional:      General: She is not in acute distress.    Appearance: Normal appearance. She is well-developed. She is not diaphoretic.  HENT:     Head: Normocephalic and atraumatic.     Right Ear: Tympanic membrane, ear canal and external ear normal.     Left Ear: Tympanic membrane, ear canal and external ear normal.     Nose: Nose normal.     Mouth/Throat:     Mouth: Mucous membranes are moist.     Pharynx: Oropharynx is clear. No oropharyngeal exudate.  Eyes:     General: No scleral icterus.    Conjunctiva/sclera: Conjunctivae normal.     Pupils: Pupils are equal, round, and reactive to light.  Neck:     Thyroid: No thyromegaly.  Cardiovascular:     Rate and Rhythm: Normal rate and regular rhythm.     Heart sounds: Normal heart sounds. No murmur heard. Pulmonary:     Effort: Pulmonary effort is normal. No respiratory distress.     Breath sounds: Normal breath sounds. No wheezing or rales.  Abdominal:     General: There is no distension.     Palpations: Abdomen is soft.     Tenderness: There is no abdominal tenderness.  Musculoskeletal:        General: No deformity.     Cervical back: Neck supple.     Right lower leg: No edema.     Left lower leg: No edema.  Lymphadenopathy:     Cervical: No cervical adenopathy.  Skin:    General: Skin is warm and dry.     Findings: No rash.  Neurological:     Mental Status: She is alert and oriented to person, place, and time. Mental status is at baseline.     Gait: Gait normal.  Psychiatric:        Mood and Affect: Mood normal.        Behavior: Behavior normal.        Thought Content: Thought content normal.      No results found for any visits on 05/17/23.  Assessment & Plan     Routine Health Maintenance and Physical Exam  Exercise Activities and Dietary recommendations  Goals      DIET - EAT MORE FRUITS AND VEGETABLES     Recommend to start eating 2 servings of fruit  every day.      DIET - EAT MORE FRUITS AND VEGETABLES        Immunization History  Administered Date(s) Administered   Influenza Inj Mdck Quad Pf 01/08/2017   Influenza Split 02/01/2011   Influenza, High Dose Seasonal PF 01/22/2018, 12/20/2018, 12/18/2021, 12/07/2022   Influenza,inj,Quad PF,6+ Mos 12/21/2014   Influenza-Unspecified 12/20/2018, 12/28/2019, 12/01/2020   PFIZER Comirnaty(Gray Top)Covid-19 Tri-Sucrose Vaccine 12/08/2020, 12/22/2021   PFIZER(Purple Top)SARS-COV-2 Vaccination 04/11/2019, 05/02/2019, 01/14/2020   PNEUMOCOCCAL CONJUGATE-20 12/18/2021   Pfizer Covid-19 Vaccine Bivalent Booster 3yrs & up 06/07/2021   Pfizer(Comirnaty)Fall Seasonal Vaccine 12 years and older 12/07/2022   Pneumococcal Conjugate-13 08/22/2017   Pneumococcal Polysaccharide-23 02/25/2019   Td 06/07/2021   Tdap 10/18/2009   Zoster Recombinant(Shingrix) 04/08/2020, 07/22/2020   Zoster, Live 04/24/2013    Health Maintenance  Topic Date Due   Lung Cancer Screening  04/02/2018   DEXA SCAN  04/19/2022   COVID-19 Vaccine (8 - 2024-25 season) 02/01/2023   Medicare Annual Wellness (AWV)  08/07/2023   Colonoscopy  11/12/2024   MAMMOGRAM  11/16/2024   DTaP/Tdap/Td (3 - Td or Tdap) 06/08/2031   Pneumonia Vaccine 59+ Years old  Completed   INFLUENZA VACCINE  Completed   Hepatitis C Screening  Completed   Zoster Vaccines- Shingrix  Completed   HPV VACCINES  Aged Out    Discussed health benefits of physical activity, and encouraged her to engage in regular exercise appropriate for her age and condition.  Problem List Items Addressed This Visit       Cardiovascular and Mediastinum   Essential (primary) hypertension   Well controlled Continue current medications Recheck metabolic panel F/u in 6  months       Venous insufficiency   Chronic leg swelling for the past month, likely due to venous insufficiency. Swelling decreases overnight and with activity but returns during the day. Currently using compression socks as recommended by rheumatologist. Discussed the role of venous insufficiency and the importance of compression therapy. Explained that if no improvement after 3-6 months, referral to vascular specialist may be necessary for potential procedures to rejuvenate veins. - Continue wearing compression socks daily. - Consider referral to vascular specialist if no improvement after 3-6 months of compression therapy.        Musculoskeletal and Integument   Rheumatoid arthritis, unspecified (HCC)   F/b Rheum      Relevant Medications   allopurinol (ZYLOPRIM) 100 MG tablet     Other   Hypercholesteremia   Previously well controlled Continue statin Repeat FLP      Relevant Orders   Lipid panel   Personal history of tobacco use, presenting hazards to health   Relevant Orders   Ambulatory Referral Lung Cancer Screening [place] [facility]   Hyperglycemia   Recommend low carb diet Recheck A1c beforen ext visit      Relevant Orders   Hemoglobin A1c   Other Visit Diagnoses       Encounter for annual physical exam    -  Primary           Cataract Surgery Complications Post-operative complications following cataract surgery one month ago. Reports worsening vision compared to pre-surgery. Ophthalmologist noted worsening of the covering over the cataract. Discussed potential for a procedure to remove the crust around the cataract, which may improve vision. Patient expressed frustration with the delay in treatment and concerns about prolonged vision impairment. - Follow up with ophthalmologist appointment tomorrow.  General Health Maintenance Routine health maintenance and screenings. Mammogram up  to date, next due in August. Shingles and pneumonia vaccinations  completed. Flu shot received in the fall. RSV vaccination recommended. Lung cancer screening CT scan postponed due to cataract surgery, rescheduled for May. Bone density scan up to date. Recent labs reviewed with no significant abnormalities except for cholesterol and A1c, which are due for recheck. Discussed the importance of RSV vaccination for her age group to prevent complications. - Administer Arexvy RSV vaccine. - Order low-dose CT scan for lung cancer screening. - Order cholesterol and A1c labs.  Follow-up - Schedule follow-up visit in 6 months.          Return in about 6 months (around 11/14/2023) for chronic disease f/u.     Shirlee Latch, MD  [facility] [phone] (phone) [phone] (fax)  [facility]

## 2023-05-18 ENCOUNTER — Encounter: Payer: Self-pay | Admitting: Family Medicine

## 2023-05-18 DIAGNOSIS — H26492 Other secondary cataract, left eye: Secondary | ICD-10-CM | POA: Diagnosis not present

## 2023-05-18 LAB — LIPID PANEL
Chol/HDL Ratio: 2.1 ratio (ref 0.0–4.4)
Cholesterol, Total: 201 mg/dL — ABNORMAL HIGH (ref 100–199)
HDL: 98 mg/dL (ref >39)
LDL Chol Calc (NIH): 80 mg/dL (ref 0–99)
Triglycerides: 137 mg/dL (ref 0–149)
VLDL Cholesterol Cal: 23 mg/dL (ref 5–40)

## 2023-05-18 LAB — HEMOGLOBIN A1C
Est. average glucose Bld gHb Est-mCnc: 103 mg/dL
Hgb A1c MFr Bld: 5.2 % (ref 4.8–5.6)

## 2023-05-20 ENCOUNTER — Other Ambulatory Visit: Payer: Self-pay | Admitting: Family Medicine

## 2023-05-20 DIAGNOSIS — I10 Essential (primary) hypertension: Secondary | ICD-10-CM

## 2023-05-20 DIAGNOSIS — I7300 Raynaud's syndrome without gangrene: Secondary | ICD-10-CM

## 2023-05-21 NOTE — Telephone Encounter (Signed)
 Requested Prescriptions  Pending Prescriptions Disp Refills   amLODipine (NORVASC) 5 MG tablet [Pharmacy Med Name: AMLODIPINE BESYLATE 5 MG TAB] 90 tablet 1    Sig: TAKE 1 TABLET (5 MG TOTAL) BY MOUTH DAILY.     Cardiovascular: Calcium Channel Blockers 2 Failed - 05/21/2023  4:19 PM      Failed - Valid encounter within last 6 months    Recent Outpatient Visits           10 months ago Cellulitis of left lower extremity   [place] [facility] [place], [place], PA-C   1 year ago [place] toe pain, left   [place] [facility] [place], [place], PA-C   1 year ago Essential (primary) hypertension   [place] [facility] [place], Marzella Schlein, MD   1 year ago Essential (primary) hypertension   [place] [facility] [place], Marzella Schlein, MD   1 year ago Foreign body in mouth, initial encounter   [facility] [place], [place], PA-C       Future Appointments             In 5 months Bacigal[place], Marzella Schlein, MD [facility]            Passed - Last BP in normal range    BP Readings from Last 1 Encounters:  05/17/23 109/74         Passed - Last Heart Rate in normal range    Pulse Readings from Last 1 Encounters:  05/17/23 77

## 2023-05-26 ENCOUNTER — Other Ambulatory Visit: Payer: Self-pay | Admitting: Family Medicine

## 2023-05-26 DIAGNOSIS — I10 Essential (primary) hypertension: Secondary | ICD-10-CM

## 2023-05-28 NOTE — Telephone Encounter (Signed)
 Requested medication (s) are due for refill today: yes  Requested medication (s) are on the active medication list: yes  Last refill:  04/21/22 #90 3 RF  Future visit scheduled: yes  Notes to clinic:  abnormal lab work    Requested Prescriptions  Pending Prescriptions Disp Refills   triamterene-hydrochlorothiazide (MAXZIDE-25) 37.5-25 MG tablet [Pharmacy Med Name: TRIAMTERENE-HCTZ 37.5-25 MG TB] 90 tablet 3    Sig: TAKE 1 TABLET BY MOUTH EVERY DAY     Cardiovascular: Diuretic Combos Failed - 05/28/2023  1:34 PM      Failed - K in normal range and within 180 days    Potassium  Date Value Ref Range Status  04/06/2022 5.0 3.5 - 5.2 mmol/L Final         Failed - Na in normal range and within 180 days    Sodium  Date Value Ref Range Status  04/06/2022 144 134 - 144 mmol/L Final         Failed - Cr in normal range and within 180 days    Creat  Date Value Ref Range Status  02/02/2017 0.88 0.50 - 0.99 mg/dL Final    Comment:    For patients >30 years of age, the reference limit for Creatinine is approximately 13% higher for people identified as African-American. .    Creatinine, Ser  Date Value Ref Range Status  04/06/2022 0.61 0.57 - 1.00 mg/dL Final         Failed - Valid encounter within last 6 months    Recent Outpatient Visits           11 months ago Cellulitis of left lower extremity   [place] [facility] [place], [place], PA-C   1 year ago [place] toe pain, left   [place], [place], PA-C   1 year ago Essential (primary) hypertension   [place] [facility] [place], Marzella Schlein, MD   1 year ago Essential (primary) hypertension   [place] [facility] [place], Marzella Schlein, MD   2 years ago Foreign body in mouth, initial encounter   [place] [facility] [place], [place], PA-C       Future Appointments             In 5 months Bacigalupo, Marzella Schlein, MD [facility]            Passed - Last BP in normal range    BP Readings from Last 1 Encounters:  05/17/23 109/74

## 2023-06-26 ENCOUNTER — Other Ambulatory Visit: Payer: Self-pay

## 2023-06-26 DIAGNOSIS — Z122 Encounter for screening for malignant neoplasm of respiratory organs: Secondary | ICD-10-CM

## 2023-06-26 DIAGNOSIS — F17210 Nicotine dependence, cigarettes, uncomplicated: Secondary | ICD-10-CM

## 2023-06-26 DIAGNOSIS — Z87891 Personal history of nicotine dependence: Secondary | ICD-10-CM

## 2023-07-11 ENCOUNTER — Ambulatory Visit
Admission: RE | Admit: 2023-07-11 | Discharge: 2023-07-11 | Disposition: A | Discharge status: Home / Self Care | Source: Ambulatory Visit | Attending: Acute Care | Admitting: Acute Care

## 2023-07-11 DIAGNOSIS — F17210 Nicotine dependence, cigarettes, uncomplicated: Secondary | ICD-10-CM | POA: Diagnosis present

## 2023-07-11 DIAGNOSIS — Z87891 Personal history of nicotine dependence: Secondary | ICD-10-CM | POA: Insufficient documentation

## 2023-07-11 DIAGNOSIS — Z122 Encounter for screening for malignant neoplasm of respiratory organs: Secondary | ICD-10-CM | POA: Insufficient documentation

## 2023-07-17 ENCOUNTER — Encounter
Admission: RE | Disposition: A | Discharge status: Home / Self Care | Payer: Self-pay | Source: Home / Self Care | Attending: Internal Medicine

## 2023-07-17 ENCOUNTER — Encounter: Payer: Self-pay | Admitting: Internal Medicine

## 2023-07-17 ENCOUNTER — Ambulatory Visit
Admission: RE | Admit: 2023-07-17 | Discharge: 2023-07-17 | Disposition: A | Discharge status: Home / Self Care | Attending: Internal Medicine | Admitting: Internal Medicine

## 2023-07-17 ENCOUNTER — Other Ambulatory Visit: Payer: Self-pay

## 2023-07-17 ENCOUNTER — Ambulatory Visit: Admitting: Anesthesiology

## 2023-07-17 DIAGNOSIS — M069 Rheumatoid arthritis, unspecified: Secondary | ICD-10-CM | POA: Insufficient documentation

## 2023-07-17 DIAGNOSIS — Z79899 Other long term (current) drug therapy: Secondary | ICD-10-CM | POA: Insufficient documentation

## 2023-07-17 DIAGNOSIS — K2270 Barrett's esophagus without dysplasia: Secondary | ICD-10-CM | POA: Insufficient documentation

## 2023-07-17 DIAGNOSIS — K219 Gastro-esophageal reflux disease without esophagitis: Secondary | ICD-10-CM | POA: Insufficient documentation

## 2023-07-17 DIAGNOSIS — Z79631 Long term (current) use of antimetabolite agent: Secondary | ICD-10-CM | POA: Insufficient documentation

## 2023-07-17 DIAGNOSIS — Z79624 Long term (current) use of inhibitors of nucleotide synthesis: Secondary | ICD-10-CM | POA: Insufficient documentation

## 2023-07-17 DIAGNOSIS — I7300 Raynaud's syndrome without gangrene: Secondary | ICD-10-CM | POA: Insufficient documentation

## 2023-07-17 DIAGNOSIS — Z87891 Personal history of nicotine dependence: Secondary | ICD-10-CM | POA: Diagnosis not present

## 2023-07-17 DIAGNOSIS — K449 Diaphragmatic hernia without obstruction or gangrene: Secondary | ICD-10-CM | POA: Insufficient documentation

## 2023-07-17 DIAGNOSIS — K2970 Gastritis, unspecified, without bleeding: Secondary | ICD-10-CM | POA: Insufficient documentation

## 2023-07-17 DIAGNOSIS — I10 Essential (primary) hypertension: Secondary | ICD-10-CM | POA: Insufficient documentation

## 2023-07-17 DIAGNOSIS — Z1289 Encounter for screening for malignant neoplasm of other sites: Secondary | ICD-10-CM | POA: Insufficient documentation

## 2023-07-17 HISTORY — PX: ESOPHAGOGASTRODUODENOSCOPY: SHX5428

## 2023-07-17 HISTORY — DX: Gout, unspecified: M10.9

## 2023-07-17 SURGERY — EGD (ESOPHAGOGASTRODUODENOSCOPY)
Anesthesia: General

## 2023-07-17 MED ORDER — SODIUM CHLORIDE 0.9 % IV SOLN: INTRAVENOUS | Status: DC

## 2023-07-17 MED ORDER — PROPOFOL 500 MG/50ML IV EMUL
INTRAVENOUS | Status: DC | PRN
  Administered 2023-07-17: 150 ug/kg/min via INTRAVENOUS

## 2023-07-17 MED ORDER — PROPOFOL 10 MG/ML IV BOLUS
INTRAVENOUS | Status: DC | PRN
  Administered 2023-07-17: 80 mg via INTRAVENOUS

## 2023-07-17 MED ORDER — LIDOCAINE HCL (CARDIAC) PF 100 MG/5ML IV SOSY
PREFILLED_SYRINGE | INTRAVENOUS | Status: DC | PRN
  Administered 2023-07-17: 60 mg via INTRAVENOUS

## 2023-07-17 NOTE — Transfer of Care (Signed)
 Immediate Anesthesia Transfer of Care Note  Patient: Courtney Rodgers  Procedure(s) Performed: EGD (ESOPHAGOGASTRODUODENOSCOPY)  Patient Location: Endoscopy Unit  Anesthesia Type:General  Level of Consciousness: awake, alert , and oriented  Airway & Oxygen Therapy: Patient Spontanous Breathing  Post-op Assessment: Report given to RN and Post -op Vital signs reviewed and stable  Post vital signs: Reviewed and stable  Last Vitals:  Vitals Value Taken Time  BP 129/70 07/17/23 1239  Temp 36.2 C 07/17/23 1239  Pulse 66 07/17/23 1242  Resp 20 07/17/23 1243  SpO2 94 % 07/17/23 1242  Vitals shown include unfiled device data.  Last Pain:  Vitals:   07/17/23 1239  TempSrc: Temporal  PainSc: 0-No pain         Complications: No notable events documented.

## 2023-07-17 NOTE — H&P (Signed)
 Outpatient short stay form Pre-procedure 07/17/2023 12:18 PM Courtney Rodgers K. Corky Diener, M.D.  Primary Physician: Alberta Almond, M.D.  Reason for visit:  Barrett's esophagus  History of present illness:  Courtney Rodgers presents to the [facility] GI clinic after receiving EGD letter in the mail. She had EGD and colonoscopy performed by Dr. Corky Diener which commented on irregular Z-line with biopsies showing minute focus of intestinal metaplasia c/w possible Barrett's esophagus. A 3-year repeat EGD was recommended. There were no mention of esophageal varices in entire esophagus. She reports she is doing very well at this time without any specific complaints. She was unaware of the diagnosis of Barrett's esophagus. She denies any issues with GERD symptoms at this time. She was having issues with GERD >5 years ago but these symptoms have resolved. She is not currently on any daily acid suppression therapy. She denies any complaints of esophageal dysphagia, odynophagia, early satiety, hoarseness, nausea, vomiting, or epigastric abdominal pain. She has gained 10-15-lbs over the past few years. She tries to work on diet and healthy physical activity but finds it difficult to lose weight. No major changes in her bowel habits. She denies any issues with hematochezia, melena, diarrhea, or constipation. No other issues or concerns at this time. No known family history of esophageal or gastric cancer.      Current Facility-Administered Medications:    0.9 %  sodium chloride  infusion, , Intravenous, Continuous, [place], Kenwood Rosiak K, MD, Last Rate: 20 mL/hr at 07/17/23 1214, Continued from Pre-op at 07/17/23 1214  Medications Prior to Admission  Medication Sig Dispense Refill Last Dose/Taking   allopurinol (ZYLOPRIM) 100 MG tablet Take 100 mg by mouth daily.   07/16/2023   amLODipine  (NORVASC ) 5 MG tablet TAKE 1 TABLET (5 MG TOTAL) BY MOUTH DAILY. 90 tablet 1 07/17/2023 at  7:00 AM   atorvastatin  (LIPITOR) 10 MG tablet Take 1  tablet (10 mg total) by mouth daily. TAKE 1 TABLET BY MOUTH EVERY DAY 60 tablet 0 07/16/2023   Biotin 5000 MCG CAPS 1 capsule daily.   07/16/2023   Calcium  Carbonate-Vit D-Min (CALCIUM  1200 PO) 1 tablet daily.   07/16/2023   carvedilol  (COREG ) 12.5 MG tablet Take 12.5 mg by mouth 2 (two) times daily with a meal.   07/16/2023   folic acid (FOLVITE) 800 MCG tablet 1 tablet daily.   07/16/2023   Multiple Vitamin (MULTI-VITAMIN) tablet Take 1 tablet by mouth daily.   07/16/2023   triamterene -hydrochlorothiazide (MAXZIDE-25) 37.5-25 MG tablet TAKE 1 TABLET BY MOUTH EVERY DAY 90 tablet 1 07/17/2023 at  7:00 AM   alendronate  (FOSAMAX ) 70 MG tablet Take 70 mg by mouth once a week.      azelastine (OPTIVAR) 0.05 % ophthalmic solution   3    cephALEXin  (KEFLEX ) 500 MG capsule Take 1 capsule (500 mg total) by mouth 2 (two) times daily. (Patient not taking: Reported on 07/17/2023) 14 capsule 0 Completed Course   COMBIGAN 0.2-0.5 % ophthalmic solution 1 DROP IN BOTH EYES TWICE A DAY  6    latanoprost (XALATAN) 0.005 % ophthalmic solution Place 1 drop into both eyes at bedtime.      methotrexate 2.5 MG tablet 6 tablets once a week.   07/15/2023   moxifloxacin (VIGAMOX) 0.5 % ophthalmic solution Place 1 drop into the left eye.      timolol (TIMOPTIC) 0.5 % ophthalmic solution Place 1 drop into both eyes 2 (two) times daily.      valACYclovir  (VALTREX ) 1000 MG tablet TAKE 1 TABLET BY MOUTH  EVERY DAY (Patient not taking: Reported on 07/17/2023) 90 tablet 0 Not Taking     No Known Allergies   Past Medical History:  Diagnosis Date   Acid reflux    Allergy    Cataract 2025   Diverticulosis 12/25/2013   Glaucoma 2020   Gout    Hyperlipidemia 04/21/23   Hypertension 2015   Raynaud's disease    Rheumatoid arthritis (HCC) 01/28/2014   GWK    Review of systems:  Otherwise negative.    Physical Exam  Gen: Alert, oriented. Appears stated age.  HEENT: [place]/AT. PERRLA. Lungs: CTA, no wheezes. CV: RR nl S1,  S2. Abd: soft, benign, no masses. BS+ Ext: No edema. Pulses 2+    Planned procedures: Proceed with EGD. The patient understands the nature of the planned procedure, indications, risks, alternatives and potential complications including but not limited to bleeding, infection, perforation, damage to internal organs and possible oversedation/side effects from anesthesia. The patient agrees and gives consent to proceed.  Please refer to procedure notes for findings, recommendations and patient disposition/instructions.     Shajuan Musso K. Corky Diener, M.D. Gastroenterology 07/17/2023  12:18 PM

## 2023-07-17 NOTE — Interval H&P Note (Signed)
 History and Physical Interval Note:  07/17/2023 12:19 PM  Courtney Rodgers  has presented today for surgery, with the diagnosis of Barrett's esophagus without dysplasia (K22.70).  The various methods of treatment have been discussed with the patient and family. After consideration of risks, benefits and other options for treatment, the patient has consented to  Procedure(s): EGD (ESOPHAGOGASTRODUODENOSCOPY) (N/A) as a surgical intervention.  The patient's history has been reviewed, patient examined, no change in status, stable for surgery.  I have reviewed the patient's chart and labs.  Questions were answered to the patient's satisfaction.     Rodgers, Courtney Kawahara

## 2023-07-17 NOTE — Op Note (Signed)
 [facility] Gastroenterology Patient Name: Courtney Rodgers Procedure Date: 07/17/2023 12:12 PM MRN: [id number] Account #: [id number] Date of Birth: 1950/11/04 Admit Type: Outpatient Age: 73 Room: [facility] ENDO ROOM 3 Gender: Female Note Status: Finalized Instrument Name: Upper Endoscope [id number] Procedure:             Upper GI endoscopy Indications:           Surveillance for malignancy due to personal history of                         Barrett's esophagus Providers:             Ramya Vanbergen K. Corky Diener MD, MD Referring MD:          Stan Eans. Bacigalupo (Referring MD) Medicines:             Propofol  per Anesthesia Complications:         No immediate complications. Estimated blood loss:                         Minimal. Procedure:             Pre-Anesthesia Assessment:                        - The risks and benefits of the procedure and the                         sedation options and risks were discussed with the                         patient. All questions were answered and informed                         consent was obtained.                        - Patient identification and proposed procedure were                         verified prior to the procedure by the nurse. The                         procedure was verified in the procedure room.                        - ASA Grade Assessment: III - A patient with severe                         systemic disease.                        - After reviewing the risks and benefits, the patient                         was deemed in satisfactory condition to undergo the                         procedure.                        After obtaining informed  consent, the endoscope was                         passed under direct vision. Throughout the procedure,                         the patient's blood pressure, pulse, and oxygen                         saturations were monitored continuously. The Endoscope                         was  introduced through the mouth, and advanced to the                         third part of duodenum. The upper GI endoscopy was                         accomplished without difficulty. The patient tolerated                         the procedure well. Findings:      There were esophageal mucosal changes secondary to established       short-segment Barrett's disease present in the distal esophagus. The       maximum longitudinal extent of these mucosal changes was 2 cm in length.       Mucosa was biopsied with a cold forceps for histology in 4 quadrants in       the lower third of the esophagus. One specimen bottle was sent to       pathology. Estimated blood loss was minimal.      No other significant abnormalities were identified in a careful       examination of the esophagus.      Patchy mild inflammation characterized by congestion (edema) and       erosions was found in the gastric antrum.      A 2 cm hiatal hernia was present.      The exam of the stomach was otherwise normal.      The examined duodenum was normal. Impression:            - Esophageal mucosal changes secondary to established                         short-segment Barrett's disease. Biopsied.                        - Gastritis.                        - 2 cm hiatal hernia.                        - Normal examined duodenum. Recommendation:        - Patient has a contact number available for                         emergencies. The signs and symptoms of potential                         delayed  complications were discussed with the patient.                         Return to normal activities tomorrow. Written                         discharge instructions were provided to the patient.                        - Resume previous diet.                        - Continue present medications.                        - Await pathology results.                        - Repeat upper endoscopy in 3 years for surveillance                          of Barrett's esophagus.                        - Return to GI clinic in 1 year.                        - The findings and recommendations were discussed with                         the patient. Procedure Code(s):     --- Professional ---                        [phone], Esophagogastroduodenoscopy, flexible,                         transoral; with biopsy, single or multiple Diagnosis Code(s):     --- Professional ---                        K44.9, Diaphragmatic hernia without obstruction or                         gangrene                        K29.70, Gastritis, unspecified, without bleeding                        K22.70, Barrett's esophagus without dysplasia CPT copyright 2022 American Medical Association. All rights reserved. The codes documented in this report are preliminary and upon coder review may  be revised to meet current compliance requirements. Cassie Click MD, MD 07/17/2023 12:36:29 PM This report has been signed electronically. Number of Addenda: 0 Note Initiated On: 07/17/2023 12:12 PM Estimated Blood Loss:  Estimated blood loss was minimal.      [facility]

## 2023-07-17 NOTE — Anesthesia Preprocedure Evaluation (Addendum)
 Anesthesia Evaluation  Patient identified by MRN, date of birth, ID band Patient awake    Reviewed: Allergy & Precautions, H&P , NPO status , Patient's Chart, lab work & pertinent test results  Airway Mallampati: II  TM Distance: >3 FB Neck ROM: full    Dental  (+) Partial Lower, Partial Upper, Missing   Pulmonary former smoker   Pulmonary exam normal        Cardiovascular hypertension, Normal cardiovascular exam     Neuro/Psych negative neurological ROS  negative psych ROS   GI/Hepatic Neg liver ROS,GERD  ,,  Endo/Other  negative endocrine ROS    Renal/GU negative Renal ROS  negative genitourinary   Musculoskeletal  (+) Arthritis , Rheumatoid disorders,    Abdominal Normal abdominal exam  (+)   Peds  Hematology negative hematology ROS (+)   Anesthesia Other Findings Past Medical History: No date: Acid reflux No date: Allergy 2025: Cataract 12/25/2013: Diverticulosis 2020: Glaucoma 04/21/23: Hyperlipidemia 2015: Hypertension No date: Raynaud's disease 01/28/2014: Rheumatoid arthritis (HCC)     Comment:  ZOX  Past Surgical History: 1980: APPENDECTOMY No date: CATARACT EXTRACTION; Right 12/25/2013: COLONOSCOPY     Comment:  repeat every 5 years per [facility]; 09/15/2011, 05/18/2008 11/13/2019: COLONOSCOPY WITH PROPOFOL ; N/A     Comment:  Procedure: COLONOSCOPY WITH PROPOFOL ;  Surgeon: [place],               Alphonsus Jeans, MD;  Location: [facility] ENDOSCOPY;  Service:               Gastroenterology;  Laterality: N/A; No date: ESOPHAGOGASTRODUODENOSCOPY 11/13/2019: ESOPHAGOGASTRODUODENOSCOPY (EGD) WITH PROPOFOL ; N/A     Comment:  Procedure: ESOPHAGOGASTRODUODENOSCOPY (EGD) WITH               PROPOFOL ;  Surgeon: [place]edo, Alphonsus Jeans, MD;  Location:               [facility] ENDOSCOPY;  Service: Gastroenterology;  Laterality:               N/A; 1919: EYE SURGERY 1960: TONSILLECTOMY     Reproductive/Obstetrics negative OB ROS                              Anesthesia Physical Anesthesia Plan  ASA: 2  Anesthesia Plan: General   Post-op Pain Management:    Induction: Intravenous  PONV Risk Score and Plan: Propofol  infusion and TIVA  Airway Management Planned: Natural Airway  Additional Equipment:   Intra-op Plan:   Post-operative Plan:   Informed Consent: I have reviewed the patients History and Physical, chart, labs and discussed the procedure including the risks, benefits and alternatives for the proposed anesthesia with the patient or authorized representative who has indicated his/her understanding and acceptance.     Dental Advisory Given  Plan Discussed with: CRNA and Surgeon  Anesthesia Plan Comments:         Anesthesia Quick Evaluation

## 2023-07-18 LAB — SURGICAL PATHOLOGY

## 2023-07-18 NOTE — Anesthesia Postprocedure Evaluation (Signed)
 Anesthesia Post Note  Patient: Illyria G Fisk  Procedure(s) Performed: EGD (ESOPHAGOGASTRODUODENOSCOPY)  Patient location during evaluation: Endoscopy Anesthesia Type: General Level of consciousness: awake and alert Pain management: pain level controlled Vital Signs Assessment: post-procedure vital signs reviewed and stable Respiratory status: spontaneous breathing, nonlabored ventilation and respiratory function stable Cardiovascular status: blood pressure returned to baseline and stable Postop Assessment: no apparent nausea or vomiting Anesthetic complications: no   No notable events documented.   Last Vitals:  Vitals:   07/17/23 1250 07/17/23 1301  BP: 138/73 (!) 149/80  Pulse: 60 (!) 52  Resp: 17 16  Temp:    SpO2: 98% 100%    Last Pain:  Vitals:   07/17/23 1250  TempSrc:   PainSc: 0-No pain                 Baltazar Bonier

## 2023-08-06 ENCOUNTER — Other Ambulatory Visit: Payer: Self-pay | Admitting: Acute Care

## 2023-08-06 DIAGNOSIS — Z122 Encounter for screening for malignant neoplasm of respiratory organs: Secondary | ICD-10-CM

## 2023-08-06 DIAGNOSIS — Z87891 Personal history of nicotine dependence: Secondary | ICD-10-CM

## 2023-08-13 ENCOUNTER — Other Ambulatory Visit: Payer: Self-pay | Admitting: Physician Assistant

## 2023-08-13 DIAGNOSIS — L03116 Cellulitis of left lower limb: Secondary | ICD-10-CM

## 2023-08-19 ENCOUNTER — Other Ambulatory Visit: Payer: Self-pay | Admitting: Family Medicine

## 2023-08-19 DIAGNOSIS — B009 Herpesviral infection, unspecified: Secondary | ICD-10-CM

## 2023-09-13 ENCOUNTER — Other Ambulatory Visit: Payer: Self-pay | Admitting: Family Medicine

## 2023-09-13 DIAGNOSIS — E7800 Pure hypercholesterolemia, unspecified: Secondary | ICD-10-CM

## 2023-11-08 ENCOUNTER — Ambulatory Visit: Payer: Medicare PPO | Admitting: Family Medicine

## 2023-11-16 ENCOUNTER — Other Ambulatory Visit: Payer: Self-pay | Admitting: Family Medicine

## 2023-11-16 DIAGNOSIS — B009 Herpesviral infection, unspecified: Secondary | ICD-10-CM

## 2023-11-23 ENCOUNTER — Other Ambulatory Visit: Payer: Self-pay | Admitting: Family Medicine

## 2023-11-23 DIAGNOSIS — I7300 Raynaud's syndrome without gangrene: Secondary | ICD-10-CM

## 2023-11-23 DIAGNOSIS — I10 Essential (primary) hypertension: Secondary | ICD-10-CM

## 2023-11-28 ENCOUNTER — Other Ambulatory Visit: Payer: Self-pay | Admitting: Family Medicine

## 2023-11-28 DIAGNOSIS — Z1231 Encounter for screening mammogram for malignant neoplasm of breast: Secondary | ICD-10-CM

## 2023-11-29 ENCOUNTER — Encounter: Payer: Self-pay | Admitting: Family Medicine

## 2023-11-29 ENCOUNTER — Ambulatory Visit: Admitting: Family Medicine

## 2023-11-29 VITALS — BP 139/75 | HR 84 | Ht 64.0 in | Wt 149.3 lb

## 2023-11-29 DIAGNOSIS — I10 Essential (primary) hypertension: Secondary | ICD-10-CM | POA: Diagnosis not present

## 2023-11-29 DIAGNOSIS — M8589 Other specified disorders of bone density and structure, multiple sites: Secondary | ICD-10-CM

## 2023-11-29 DIAGNOSIS — Z23 Encounter for immunization: Secondary | ICD-10-CM

## 2023-11-29 DIAGNOSIS — E7800 Pure hypercholesterolemia, unspecified: Secondary | ICD-10-CM | POA: Diagnosis not present

## 2023-11-29 DIAGNOSIS — M810 Age-related osteoporosis without current pathological fracture: Secondary | ICD-10-CM

## 2023-11-29 DIAGNOSIS — I480 Paroxysmal atrial fibrillation: Secondary | ICD-10-CM | POA: Diagnosis not present

## 2023-11-29 NOTE — Progress Notes (Signed)
 Established patient visit   Patient: Courtney Rodgers   DOB: Oct 05, 1950   73 y.o. Female  MRN: [id number] Visit Date: 11/29/2023  Today's healthcare provider: Jon Eva, MD   Chief Complaint  Patient presents with   Medical Management of Chronic Issues   Hypertension   Subjective    HPI   Discussed the use of AI scribe software for clinical note transcription with the patient, who gave verbal consent to proceed.  History of Present Illness   Courtney Rodgers is a 73 year old female with hypertension, paroxysmal atrial fibrillation, hyperlipidemia, and osteopenia who presents for chronic follow-up.  She manages her hypertension, atrial fibrillation, and hyperlipidemia with atorvastatin  10 mg daily, amlodipine  5 mg daily, carvedilol  12.5 mg twice daily, and triamterene -HCTZ 37.5/25 mg daily. She adheres to her medication regimen without issues and experiences no headaches or dizziness. Her paroxysmal atrial fibrillation is stable, with no recent episodes or palpitations.  She has been taking Fosamax  weekly for osteopenia since 2020.  Socially, she is actively involved in babysitting her nine-month-old grandson, which she finds physically demanding.         Medications: Outpatient Medications Prior to Visit  Medication Sig   alendronate  (FOSAMAX ) 70 MG tablet TAKE 1 TABLET (70 MG TOTAL) BY MOUTH ONCE A WEEK. TAKE WITH A FULL GLASS OF WATER ON AN EMPTY STOMACH.   allopurinol (ZYLOPRIM) 100 MG tablet Take 100 mg by mouth daily.   amLODipine  (NORVASC ) 5 MG tablet TAKE 1 TABLET (5 MG TOTAL) BY MOUTH DAILY.   atorvastatin  (LIPITOR) 10 MG tablet TAKE 1 TABLET (10 MG TOTAL) BY MOUTH DAILY   azelastine (OPTIVAR) 0.05 % ophthalmic solution    Biotin 5000 MCG CAPS 1 capsule daily.   Calcium  Carbonate-Vit D-Min (CALCIUM  1200 PO) 1 tablet daily.   carvedilol  (COREG ) 12.5 MG tablet Take 12.5 mg by mouth 2 (two) times daily with a meal.   COMBIGAN 0.2-0.5 % ophthalmic  solution 1 DROP IN BOTH EYES TWICE A DAY   folic acid (FOLVITE) 800 MCG tablet 1 tablet daily.   latanoprost (XALATAN) 0.005 % ophthalmic solution Place 1 drop into both eyes at bedtime.   methotrexate 2.5 MG tablet 6 tablets once a week.   moxifloxacin (VIGAMOX) 0.5 % ophthalmic solution Place 1 drop into the left eye.   Multiple Vitamin (MULTI-VITAMIN) tablet Take 1 tablet by mouth daily.   timolol (TIMOPTIC) 0.5 % ophthalmic solution Place 1 drop into both eyes 2 (two) times daily.   triamterene -hydrochlorothiazide (MAXZIDE-25) 37.5-25 MG tablet TAKE 1 TABLET BY MOUTH EVERY DAY   valACYclovir  (VALTREX ) 1000 MG tablet TAKE 1 TABLET BY MOUTH EVERY DAY   [DISCONTINUED] cephALEXin  (KEFLEX ) 500 MG capsule Take 1 capsule (500 mg total) by mouth 2 (two) times daily. (Patient not taking: Reported on 07/17/2023)   No facility-administered medications prior to visit.    Review of Systems     Objective    BP 139/75 (BP Location: Left Arm, Patient Position: Sitting, Cuff Size: Normal)   Pulse 84   Ht 5' 4 (1.626 m)   Wt 149 lb 4.8 oz (67.7 kg)   SpO2 99%   BMI 25.63 kg/m    Physical Exam Vitals reviewed.  Constitutional:      General: She is not in acute distress.    Appearance: Normal appearance. She is well-developed. She is not diaphoretic.  HENT:     Head: Normocephalic and atraumatic.  Eyes:     General: No scleral  icterus.    Conjunctiva/sclera: Conjunctivae normal.  Neck:     Thyroid: No thyromegaly.  Cardiovascular:     Rate and Rhythm: Normal rate and regular rhythm.     Heart sounds: Normal heart sounds. No murmur heard. Pulmonary:     Effort: Pulmonary effort is normal. No respiratory distress.     Breath sounds: Normal breath sounds. No wheezing, rhonchi or rales.  Musculoskeletal:     Cervical back: Neck supple.     Right lower leg: No edema.     Left lower leg: No edema.  Lymphadenopathy:     Cervical: No cervical adenopathy.  Skin:    General: Skin is warm  and dry.     Findings: No rash.  Neurological:     Mental Status: She is alert and oriented to person, place, and time. Mental status is at baseline.  Psychiatric:        Mood and Affect: Mood normal.        Behavior: Behavior normal.      No results found for any visits on 11/29/23.  Assessment & Plan     Problem List Items Addressed This Visit       Cardiovascular and Mediastinum   Essential (primary) hypertension - Primary   Blood pressure is well-controlled with current medication regimen. No issues with medication adherence or side effects.      Relevant Orders   Basic Metabolic Panel (BMET)   Paroxysmal atrial fibrillation (HCC)   No recent episodes or symptoms, indicating a well-managed condition.        Musculoskeletal and Integument   Osteoporosis   On Fosamax  for five years. A bone density scan is planned to assess stability or improvement. - Order bone density scan - Consider drug holiday from Fosamax  if bone density scan shows stability or improvement         Other   Hypercholesteremia   Cholesterol levels have improved with atorvastatin . Last lab results were satisfactory, and annual monitoring is sufficient.      Other Visit Diagnoses       Immunization due       Relevant Orders   Flu vaccine HIGH DOSE PF(Fluzone Trivalent) (Completed)         Return in about 6 months (around 05/28/2024) for CPE.       Jon Eva, MD  [facility] [phone] (phone) [phone] (fax)  [facility]

## 2023-11-29 NOTE — Assessment & Plan Note (Signed)
 Blood pressure is well-controlled with current medication regimen. No issues with medication adherence or side effects.

## 2023-11-29 NOTE — Assessment & Plan Note (Signed)
 On Fosamax  for five years. A bone density scan is planned to assess stability or improvement. - Order bone density scan - Consider drug holiday from Fosamax  if bone density scan shows stability or improvement

## 2023-11-29 NOTE — Assessment & Plan Note (Signed)
 No recent episodes or symptoms, indicating a well-managed condition.

## 2023-11-29 NOTE — Assessment & Plan Note (Signed)
 Cholesterol levels have improved with atorvastatin . Last lab results were satisfactory, and annual monitoring is sufficient.

## 2023-11-30 LAB — BASIC METABOLIC PANEL WITH GFR
BUN/Creatinine Ratio: 25 (ref 12–28)
BUN: 16 mg/dL (ref 8–27)
CO2: 24 mmol/L (ref 20–29)
Calcium: 9.8 mg/dL (ref 8.7–10.3)
Chloride: 98 mmol/L (ref 96–106)
Creatinine, Ser: 0.64 mg/dL (ref 0.57–1.00)
Glucose: 83 mg/dL (ref 70–99)
Potassium: 3.5 mmol/L (ref 3.5–5.2)
Sodium: 138 mmol/L (ref 134–144)
eGFR: 94 mL/min/1.73 (ref >59)

## 2023-12-03 ENCOUNTER — Ambulatory Visit: Payer: Self-pay | Admitting: Family Medicine

## 2023-12-14 DIAGNOSIS — H04123 Dry eye syndrome of bilateral lacrimal glands: Secondary | ICD-10-CM | POA: Diagnosis not present

## 2023-12-14 DIAGNOSIS — H401213 Low-tension glaucoma, right eye, severe stage: Secondary | ICD-10-CM | POA: Diagnosis not present

## 2023-12-14 DIAGNOSIS — H35371 Puckering of macula, right eye: Secondary | ICD-10-CM | POA: Diagnosis not present

## 2023-12-14 DIAGNOSIS — H26493 Other secondary cataract, bilateral: Secondary | ICD-10-CM | POA: Diagnosis not present

## 2023-12-14 DIAGNOSIS — H401222 Low-tension glaucoma, left eye, moderate stage: Secondary | ICD-10-CM | POA: Diagnosis not present

## 2023-12-25 ENCOUNTER — Ambulatory Visit
Admission: RE | Admit: 2023-12-25 | Discharge: 2023-12-25 | Disposition: A | Source: Ambulatory Visit | Attending: Family Medicine

## 2023-12-25 ENCOUNTER — Ambulatory Visit
Admission: RE | Admit: 2023-12-25 | Discharge: 2023-12-25 | Disposition: A | Discharge status: Home / Self Care | Source: Ambulatory Visit | Attending: Family Medicine | Admitting: Family Medicine

## 2023-12-25 DIAGNOSIS — Z1231 Encounter for screening mammogram for malignant neoplasm of breast: Secondary | ICD-10-CM | POA: Insufficient documentation

## 2023-12-25 DIAGNOSIS — Z780 Asymptomatic menopausal state: Secondary | ICD-10-CM | POA: Diagnosis not present

## 2023-12-25 DIAGNOSIS — M8589 Other specified disorders of bone density and structure, multiple sites: Secondary | ICD-10-CM | POA: Diagnosis not present

## 2023-12-27 ENCOUNTER — Ambulatory Visit: Payer: Self-pay | Admitting: Family Medicine

## 2024-01-07 DIAGNOSIS — M1A9XX0 Chronic gout, unspecified, without tophus (tophi): Secondary | ICD-10-CM | POA: Diagnosis not present

## 2024-01-07 DIAGNOSIS — Z79899 Other long term (current) drug therapy: Secondary | ICD-10-CM | POA: Diagnosis not present

## 2024-01-07 DIAGNOSIS — M0579 Rheumatoid arthritis with rheumatoid factor of multiple sites without organ or systems involvement: Secondary | ICD-10-CM | POA: Diagnosis not present

## 2024-01-07 DIAGNOSIS — M1A09X0 Idiopathic chronic gout, multiple sites, without tophus (tophi): Secondary | ICD-10-CM | POA: Diagnosis not present

## 2024-01-07 DIAGNOSIS — I7300 Raynaud's syndrome without gangrene: Secondary | ICD-10-CM | POA: Diagnosis not present

## 2024-02-10 ENCOUNTER — Other Ambulatory Visit: Payer: Self-pay | Admitting: Family Medicine

## 2024-02-10 DIAGNOSIS — B009 Herpesviral infection, unspecified: Secondary | ICD-10-CM

## 2024-03-07 ENCOUNTER — Other Ambulatory Visit: Payer: Self-pay | Admitting: Family Medicine

## 2024-03-07 DIAGNOSIS — E7800 Pure hypercholesterolemia, unspecified: Secondary | ICD-10-CM

## 2024-05-29 ENCOUNTER — Encounter: Admitting: Family Medicine
